# Patient Record
Sex: Male | Born: 1980 | Race: White | Hispanic: No | Marital: Single | Smoking: Never smoker
Health system: Southern US, Community
[De-identification: ages and names within clinical notes are randomized; demographics above are authoritative.]

## PROBLEM LIST (undated history)

## (undated) DIAGNOSIS — G4700 Insomnia, unspecified: Secondary | ICD-10-CM

## (undated) DIAGNOSIS — M549 Dorsalgia, unspecified: Secondary | ICD-10-CM

## (undated) DIAGNOSIS — G8929 Other chronic pain: Secondary | ICD-10-CM

## (undated) HISTORY — PX: OTHER SURGICAL HISTORY: SHX169

---
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
## 2003-04-09 ENCOUNTER — Emergency Department (HOSPITAL_COMMUNITY): Admission: EM | Admit: 2003-04-09 | Discharge: 2003-04-09 | Payer: Self-pay | Admitting: Emergency Medicine

## 2003-04-09 ENCOUNTER — Encounter: Payer: Self-pay | Admitting: Emergency Medicine

## 2003-04-11 ENCOUNTER — Emergency Department (HOSPITAL_COMMUNITY): Admission: EM | Admit: 2003-04-11 | Discharge: 2003-04-11 | Payer: Self-pay | Admitting: Emergency Medicine

## 2003-04-13 ENCOUNTER — Encounter: Payer: Self-pay | Admitting: Family Medicine

## 2003-04-13 ENCOUNTER — Ambulatory Visit (HOSPITAL_COMMUNITY): Admission: RE | Admit: 2003-04-13 | Discharge: 2003-04-13 | Payer: Self-pay | Admitting: Family Medicine

## 2005-12-08 ENCOUNTER — Emergency Department: Payer: Self-pay | Admitting: Emergency Medicine

## 2006-06-26 ENCOUNTER — Other Ambulatory Visit: Payer: Self-pay

## 2006-06-26 ENCOUNTER — Emergency Department: Payer: Self-pay | Admitting: Emergency Medicine

## 2006-06-27 ENCOUNTER — Emergency Department: Payer: Self-pay | Admitting: Internal Medicine

## 2007-08-10 ENCOUNTER — Emergency Department (HOSPITAL_COMMUNITY): Admission: EM | Admit: 2007-08-10 | Discharge: 2007-08-10 | Payer: Self-pay | Admitting: Emergency Medicine

## 2010-10-24 ENCOUNTER — Emergency Department (HOSPITAL_COMMUNITY)
Admission: EM | Admit: 2010-10-24 | Discharge: 2010-10-24 | Disposition: A | Discharge status: Home / Self Care | Payer: Medicaid Other | Attending: Emergency Medicine | Admitting: Emergency Medicine

## 2010-10-24 ENCOUNTER — Emergency Department (HOSPITAL_COMMUNITY): Payer: Medicaid Other

## 2010-10-24 DIAGNOSIS — S93409A Sprain of unspecified ligament of unspecified ankle, initial encounter: Secondary | ICD-10-CM | POA: Insufficient documentation

## 2010-10-24 DIAGNOSIS — S82899A Other fracture of unspecified lower leg, initial encounter for closed fracture: Secondary | ICD-10-CM | POA: Insufficient documentation

## 2010-10-24 DIAGNOSIS — W1809XA Striking against other object with subsequent fall, initial encounter: Secondary | ICD-10-CM | POA: Insufficient documentation

## 2010-11-28 ENCOUNTER — Emergency Department (HOSPITAL_COMMUNITY)
Admission: EM | Admit: 2010-11-28 | Discharge: 2010-11-28 | Disposition: A | Discharge status: Home / Self Care | Payer: Medicaid Other | Attending: Emergency Medicine | Admitting: Emergency Medicine

## 2010-11-28 DIAGNOSIS — W57XXXA Bitten or stung by nonvenomous insect and other nonvenomous arthropods, initial encounter: Secondary | ICD-10-CM | POA: Insufficient documentation

## 2010-11-28 DIAGNOSIS — T148 Other injury of unspecified body region: Secondary | ICD-10-CM | POA: Insufficient documentation

## 2011-01-18 ENCOUNTER — Emergency Department (HOSPITAL_COMMUNITY)
Admission: EM | Admit: 2011-01-18 | Discharge: 2011-01-18 | Discharge status: Against Medical Advice | Payer: Medicaid Other | Attending: Emergency Medicine | Admitting: Emergency Medicine

## 2011-01-18 DIAGNOSIS — S0990XA Unspecified injury of head, initial encounter: Secondary | ICD-10-CM | POA: Insufficient documentation

## 2011-01-18 DIAGNOSIS — W1809XA Striking against other object with subsequent fall, initial encounter: Secondary | ICD-10-CM | POA: Insufficient documentation

## 2011-05-07 ENCOUNTER — Emergency Department (HOSPITAL_COMMUNITY)
Admission: EM | Admit: 2011-05-07 | Discharge: 2011-05-07 | Disposition: A | Discharge status: Home / Self Care | Payer: Medicaid Other | Attending: Emergency Medicine | Admitting: Emergency Medicine

## 2011-05-07 DIAGNOSIS — R21 Rash and other nonspecific skin eruption: Secondary | ICD-10-CM | POA: Insufficient documentation

## 2011-06-02 ENCOUNTER — Emergency Department (HOSPITAL_COMMUNITY)
Admission: EM | Admit: 2011-06-02 | Discharge: 2011-06-03 | Discharge status: Against Medical Advice | Payer: Medicaid Other | Attending: Emergency Medicine | Admitting: Emergency Medicine

## 2011-06-02 ENCOUNTER — Encounter: Payer: Self-pay | Admitting: *Deleted

## 2011-06-02 DIAGNOSIS — R109 Unspecified abdominal pain: Secondary | ICD-10-CM | POA: Insufficient documentation

## 2011-06-02 LAB — URINALYSIS, ROUTINE W REFLEX MICROSCOPIC
Bilirubin Urine: NEGATIVE
Ketones, ur: NEGATIVE mg/dL
Nitrite: NEGATIVE
pH: 7.5 (ref 5.0–8.0)

## 2011-06-02 LAB — URINE MICROSCOPIC-ADD ON

## 2011-06-02 NOTE — ED Notes (Signed)
Rt flank pain since this am.  No nv

## 2011-07-29 ENCOUNTER — Emergency Department (HOSPITAL_COMMUNITY)
Admission: EM | Admit: 2011-07-29 | Discharge: 2011-07-29 | Discharge status: Against Medical Advice | Payer: Medicaid Other | Attending: Emergency Medicine | Admitting: Emergency Medicine

## 2011-07-29 ENCOUNTER — Encounter (HOSPITAL_COMMUNITY): Payer: Self-pay | Admitting: *Deleted

## 2011-07-29 ENCOUNTER — Other Ambulatory Visit: Payer: Self-pay

## 2011-07-29 DIAGNOSIS — R079 Chest pain, unspecified: Secondary | ICD-10-CM | POA: Insufficient documentation

## 2011-07-29 NOTE — ED Notes (Signed)
No old EKG in [facility]. Today's EKG shown to Dr. Lourena Simmonds

## 2011-07-29 NOTE — ED Notes (Signed)
Pt has had  Chest pain mid for 2 days.  nv and diarrhea.  He admits to drinking alcoholo today.  He arrived by [facility] and they placed an iv in his lt hand

## 2011-07-29 NOTE — ED Notes (Signed)
The pt says he cannot wait any longer.  Determined to leave.  Iv removed pt left

## 2011-07-31 ENCOUNTER — Emergency Department (HOSPITAL_COMMUNITY)
Admission: EM | Admit: 2011-07-31 | Discharge: 2011-08-01 | Discharge status: Against Medical Advice | Payer: Medicaid Other | Attending: Emergency Medicine | Admitting: Emergency Medicine

## 2011-07-31 ENCOUNTER — Encounter (HOSPITAL_COMMUNITY): Payer: Self-pay | Admitting: Emergency Medicine

## 2011-07-31 DIAGNOSIS — R059 Cough, unspecified: Secondary | ICD-10-CM | POA: Insufficient documentation

## 2011-07-31 DIAGNOSIS — R05 Cough: Secondary | ICD-10-CM | POA: Insufficient documentation

## 2011-07-31 MED ORDER — ACETAMINOPHEN 325 MG PO TABS
650.0000 mg | ORAL_TABLET | Freq: Once | ORAL | Status: AC
  Administered 2011-07-31: 650 mg via ORAL

## 2011-07-31 MED ORDER — ACETAMINOPHEN 325 MG PO TABS
ORAL_TABLET | ORAL | Status: AC
  Filled 2011-07-31: qty 2

## 2011-07-31 NOTE — ED Notes (Signed)
Pt alert, presents with cough and congestion, onset was yesterday, resp even unlabored, skin pwd, moist npc noted

## 2012-03-27 ENCOUNTER — Encounter (HOSPITAL_COMMUNITY): Payer: Self-pay | Admitting: *Deleted

## 2012-03-27 ENCOUNTER — Emergency Department (HOSPITAL_COMMUNITY)
Admission: EM | Admit: 2012-03-27 | Discharge: 2012-03-28 | Disposition: A | Discharge status: Home / Self Care | Payer: Medicaid Other | Attending: Emergency Medicine | Admitting: Emergency Medicine

## 2012-03-27 ENCOUNTER — Emergency Department (HOSPITAL_COMMUNITY): Payer: Medicaid Other

## 2012-03-27 DIAGNOSIS — J45909 Unspecified asthma, uncomplicated: Secondary | ICD-10-CM | POA: Insufficient documentation

## 2012-03-27 DIAGNOSIS — F10929 Alcohol use, unspecified with intoxication, unspecified: Secondary | ICD-10-CM

## 2012-03-27 DIAGNOSIS — F1010 Alcohol abuse, uncomplicated: Secondary | ICD-10-CM | POA: Insufficient documentation

## 2012-03-27 DIAGNOSIS — I10 Essential (primary) hypertension: Secondary | ICD-10-CM | POA: Insufficient documentation

## 2012-03-27 DIAGNOSIS — R079 Chest pain, unspecified: Secondary | ICD-10-CM | POA: Insufficient documentation

## 2012-03-27 LAB — COMPREHENSIVE METABOLIC PANEL
ALT: 44 U/L (ref 0–53)
Alkaline Phosphatase: 66 U/L (ref 39–117)
BUN: 7 mg/dL (ref 6–23)
CO2: 25 mEq/L (ref 19–32)
Chloride: 104 mEq/L (ref 96–112)
GFR calc Af Amer: >90 mL/min (ref >90)
Glucose, Bld: 101 mg/dL — ABNORMAL HIGH (ref 70–99)
Potassium: 4.1 mEq/L (ref 3.5–5.1)
Total Bilirubin: 0.1 mg/dL — ABNORMAL LOW (ref 0.3–1.2)

## 2012-03-27 LAB — CBC WITH DIFFERENTIAL/PLATELET
Hemoglobin: 15 g/dL (ref 13.0–17.0)
Lymphocytes Relative: 55 % — ABNORMAL HIGH (ref 12–46)
Lymphs Abs: 4.9 10*3/uL — ABNORMAL HIGH (ref 0.7–4.0)
MCH: 30.7 pg (ref 26.0–34.0)
Monocytes Relative: 5 % (ref 3–12)
Neutro Abs: 3.3 10*3/uL (ref 1.7–7.7)
Neutrophils Relative %: 36 % — ABNORMAL LOW (ref 43–77)
RBC: 4.89 MIL/uL (ref 4.22–5.81)
WBC: 9 10*3/uL (ref 4.0–10.5)

## 2012-03-27 LAB — ETHANOL: Alcohol, Ethyl (B): 157 mg/dL — ABNORMAL HIGH (ref 0–11)

## 2012-03-27 MED ORDER — ALBUTEROL SULFATE HFA 108 (90 BASE) MCG/ACT IN AERS
2.0000 | INHALATION_SPRAY | Freq: Four times a day (QID) | RESPIRATORY_TRACT | Status: DC
  Administered 2012-03-27: 2 via RESPIRATORY_TRACT
  Filled 2012-03-27: qty 6.7

## 2012-03-27 NOTE — ED Provider Notes (Signed)
History     CSN: [id number]  Arrival date & time 03/27/12  1954   First MD Initiated Contact with Patient 03/27/12 2032      Chief Complaint  Patient presents with  . Fall  . Alcohol Intoxication   HPI The patient presents with chest pain.  History of present illness is per the patient and his sister.  They note that yesterday the patient developed anterior chest discomfort with mild dyspnea.  The patient has a history of asthma, ran out of his inhaler yesterday.  She also notes that the patient fell in the bathtub yesterday without loss of consciousness or significant head trauma.  The patient is sleeping throughout most my exam, but awakens easily answers questions, but also sleeping quickly.  He notes that he was in his usual state of health until yesterday.  He endorses alcohol use, up to 2 beers daily.  He denies any drug use. Past Medical History  Diagnosis Date  . Asthma   . Hypertension   . Bilateral posterior subcapsular polar cataract     Past Surgical History  Procedure Date  . None no surgery    History reviewed. No pertinent family history.  History  Substance Use Topics  . Smoking status: Never Smoker   . Smokeless tobacco: Not on file  . Alcohol Use: Yes      Review of Systems  Constitutional:       Per HPI, otherwise negative  HENT:       Per HPI, otherwise negative  Eyes: Negative.   Respiratory:       Per HPI, otherwise negative  Cardiovascular:       Per HPI, otherwise negative  Gastrointestinal: Negative for vomiting.  Genitourinary: Negative.   Musculoskeletal:       Per HPI, otherwise negative  Skin: Negative.   Neurological: Negative for syncope.    Allergies  Penicillins and Sulfa antibiotics  Home Medications  No current outpatient prescriptions on file.  BP 112/69  Pulse 88  Temp 97.5 F (36.4 C) (Oral)  Resp 12  SpO2 93%  Physical Exam  Nursing note and vitals reviewed. Constitutional: He is oriented to person, place,  and time. He appears well-developed. No distress.       Patient is asleep, but awakens easily, speaks clearly, falls back asleep quickly  HENT:  Head: Normocephalic and atraumatic.  Eyes: Conjunctivae and EOM are normal.  Cardiovascular: Normal rate and regular rhythm.   Pulmonary/Chest: Effort normal. No stridor. No respiratory distress. He has no wheezes. He has no rales.  Abdominal: He exhibits no distension.  Musculoskeletal: He exhibits no edema.  Neurological: He is alert and oriented to person, place, and time.  Skin: Skin is warm and dry.  Psychiatric: He has a normal mood and affect.    ED Course  Procedures (including critical care time)   Labs Reviewed  CBC WITH DIFFERENTIAL  COMPREHENSIVE METABOLIC PANEL  ETHANOL   No results found.   No diagnosis found.  O2: 100%ra, normal  11:28 PM Patient sleeping  Etoh: 157   Date: 03/27/2012  Rate: 86  Rhythm: normal sinus rhythm  QRS Axis: normal  Intervals: normal  ST/T Wave abnormalities: normal  Conduction Disutrbances:none  Narrative Interpretation:   Old EKG Reviewed: none available RSR, borderline q waves inferiorly Abnormal ecg    MDM  This young male presents with concerns of chest pain, a fall, and on exam is sleeping, though she awakens easily, and is in no distress.  The patient's vital signs are unremarkable, there is no overt evidence of trauma.  The patient denies any head trauma or loss of consciousness.  The patient's chest pain may be secondary to this fall against the bathtub or his sister of asthma, though his lungs are clear on exam.  The patient's labs notable for demonstration of alcohol intoxication.  His ECG is nonischemic, non-dysrhythmic, and given the absence of acute findings, but worse crackers, the patient is appropriate for discharge into the care of his sister, with outpatient followup.   Gerhard Munch, MD 03/27/12 [phone]

## 2012-03-27 NOTE — ED Notes (Signed)
Pt fell in bath tub yesterday no LOC,  Side pain and pt also has ETOH on board,  Drank two 40 ounces today

## 2012-03-27 NOTE — ED Notes (Signed)
Bed:WA06<BR> Expected date:<BR> Expected time:<BR> Means of arrival:<BR> Comments:<BR>

## 2012-03-27 NOTE — ED Notes (Signed)
1st attempt to obtain labs pt in x-ray °

## 2012-04-01 ENCOUNTER — Encounter (HOSPITAL_COMMUNITY): Payer: Self-pay | Admitting: Emergency Medicine

## 2012-04-01 DIAGNOSIS — I10 Essential (primary) hypertension: Secondary | ICD-10-CM | POA: Insufficient documentation

## 2012-04-01 DIAGNOSIS — Z880 Allergy status to penicillin: Secondary | ICD-10-CM | POA: Insufficient documentation

## 2012-04-01 DIAGNOSIS — J45909 Unspecified asthma, uncomplicated: Secondary | ICD-10-CM | POA: Insufficient documentation

## 2012-04-01 DIAGNOSIS — Z882 Allergy status to sulfonamides status: Secondary | ICD-10-CM | POA: Insufficient documentation

## 2012-04-01 DIAGNOSIS — L0211 Cutaneous abscess of neck: Secondary | ICD-10-CM | POA: Insufficient documentation

## 2012-04-01 NOTE — ED Notes (Signed)
Abccess behind left ear. Tried to pop but became worse. Is reddened with purulent drainage.

## 2012-04-02 ENCOUNTER — Emergency Department (HOSPITAL_COMMUNITY)
Admission: EM | Admit: 2012-04-02 | Discharge: 2012-04-02 | Disposition: A | Discharge status: Home / Self Care | Payer: Medicaid Other | Attending: Emergency Medicine | Admitting: Emergency Medicine

## 2012-04-02 DIAGNOSIS — L0291 Cutaneous abscess, unspecified: Secondary | ICD-10-CM

## 2012-04-02 NOTE — ED Provider Notes (Signed)
History     CSN: [id number]  Arrival date & time 04/01/12  2211   First MD Initiated Contact with Patient 04/02/12 0128      Chief Complaint  Patient presents with  . Abscess    (Consider location/radiation/quality/duration/timing/severity/associated sxs/prior treatment) Patient is a 31 y.o. male presenting with abscess. The history is provided by the patient.  Abscess  This is a new problem. The current episode started less than one week ago. The problem occurs rarely. The problem has been gradually worsening. The abscess is present on the neck. The problem is mild. The abscess is characterized by redness, painfulness and draining. It is unknown what he was exposed to. Pertinent negatives include no anorexia, no decrease in physical activity, no fever, no fussiness, not sleeping more, no congestion, no rhinorrhea and no sore throat.    Past Medical History  Diagnosis Date  . Asthma   . Hypertension   . Bilateral posterior subcapsular polar cataract     Past Surgical History  Procedure Date  . None no surgery    No family history on file.  History  Substance Use Topics  . Smoking status: Never Smoker   . Smokeless tobacco: Not on file  . Alcohol Use: Yes      Review of Systems  Constitutional: Negative for fever, chills and appetite change.  HENT: Negative for congestion, sore throat and rhinorrhea.   Eyes: Negative for visual disturbance.  Respiratory: Negative for shortness of breath.   Cardiovascular: Negative for chest pain and leg swelling.  Gastrointestinal: Negative for abdominal pain and anorexia.  Genitourinary: Negative for dysuria, urgency and frequency.  Neurological: Negative for dizziness, syncope, weakness, light-headedness, numbness and headaches.  Psychiatric/Behavioral: Negative for confusion.    Allergies  Penicillins and Sulfa antibiotics  Home Medications   Current Outpatient Rx  Name Route Sig Dispense Refill  . ALBUTEROL SULFATE HFA  108 (90 BASE) MCG/ACT IN AERS Inhalation Inhale 2 puffs into the lungs every 6 (six) hours as needed. For asthma symptoms    . OXYCODONE-ACETAMINOPHEN 10-325 MG PO TABS Oral Take 1 tablet by mouth every 4 (four) hours as needed. For pain      BP 126/86  Pulse 87  Temp 98.5 F (36.9 C) (Oral)  Resp 18  SpO2 99%  Physical Exam  Nursing note and vitals reviewed. Constitutional: He is oriented to person, place, and time. He appears well-developed and well-nourished. He does not have a sickly appearance. He does not appear ill. No distress.  HENT:  Head: Normocephalic and atraumatic.  Eyes: Conjunctivae and EOM are normal.  Neck: Normal range of motion. Neck supple.  Cardiovascular: Normal rate and regular rhythm.   Pulmonary/Chest: Effort normal and breath sounds normal.  Musculoskeletal: He exhibits no edema.  Lymphadenopathy:       Head (right side): No submental, no preauricular and no posterior auricular adenopathy present.       Head (left side): No submental, no submandibular, no preauricular and no posterior auricular adenopathy present.    He has no axillary adenopathy.  Neurological: He is alert and oriented to person, place, and time.  Skin: Skin is warm and dry. No rash noted. He is not diaphoretic.       3cm sized abscess located on left neck. Extreme tenderness to palpation. Currently draining. Abscess is fluctuant without warmth, surrounding erythema, or induration.    ED Course  Procedures (including critical care time)  Labs Reviewed - No data to display No results found.  No diagnosis found.  INCISION AND DRAINAGE Performed by: Jaci Carrel Consent: Verbal consent obtained. Risks and benefits: risks, benefits and alternatives were discussed Type: abscess  Body area: left neck   Anesthesia: local infiltration  Local anesthetic: lidocaine 2% w epinephrine  Anesthetic total: 2 ml  Complexity: complex Blunt dissection to break up  loculations  Drainage: purulent  Drainage amount: scant   Packing material: not warranted   Patient tolerance: Patient tolerated the procedure well with no immediate complications.     MDM  Abscess Neck  Patient with skin abscess amenable to incision and drainage.  Abscess was not  large enough to warrant packing, wound recheck in 2 days. No signs of cellulitis is surrounding skin.  Will d/c to home.  No antibiotic therapy is indicated.         Jaci Carrel, [place] 04/02/12 [phone]

## 2012-04-03 NOTE — ED Provider Notes (Signed)
History/physical exam/procedure(s) were performed by non-physician practitioner and as supervising physician I was immediately available for consultation/collaboration. I have reviewed all notes and am in agreement with care and plan.   Marvie Brevik S Sevastian Witczak, MD 04/03/12 0116 

## 2012-04-04 ENCOUNTER — Emergency Department (HOSPITAL_COMMUNITY)
Admission: EM | Admit: 2012-04-04 | Discharge: 2012-04-04 | Disposition: A | Discharge status: Home / Self Care | Payer: Medicaid Other | Attending: Emergency Medicine | Admitting: Emergency Medicine

## 2012-04-04 ENCOUNTER — Encounter (HOSPITAL_COMMUNITY): Payer: Self-pay | Admitting: Emergency Medicine

## 2012-04-04 DIAGNOSIS — L0291 Cutaneous abscess, unspecified: Secondary | ICD-10-CM

## 2012-04-04 DIAGNOSIS — Z880 Allergy status to penicillin: Secondary | ICD-10-CM | POA: Insufficient documentation

## 2012-04-04 DIAGNOSIS — I10 Essential (primary) hypertension: Secondary | ICD-10-CM | POA: Insufficient documentation

## 2012-04-04 DIAGNOSIS — J45909 Unspecified asthma, uncomplicated: Secondary | ICD-10-CM | POA: Insufficient documentation

## 2012-04-04 DIAGNOSIS — Z882 Allergy status to sulfonamides status: Secondary | ICD-10-CM | POA: Insufficient documentation

## 2012-04-04 DIAGNOSIS — L0211 Cutaneous abscess of neck: Secondary | ICD-10-CM | POA: Insufficient documentation

## 2012-04-04 DIAGNOSIS — L03221 Cellulitis of neck: Secondary | ICD-10-CM | POA: Insufficient documentation

## 2012-04-04 MED ORDER — HYDROCODONE-ACETAMINOPHEN 5-500 MG PO TABS: 1.0000 | ORAL_TABLET | Freq: Four times a day (QID) | ORAL | Status: AC | PRN

## 2012-04-04 MED ORDER — DOXYCYCLINE HYCLATE 100 MG PO CAPS: 100.0000 mg | ORAL_CAPSULE | Freq: Two times a day (BID) | ORAL | Status: AC

## 2012-04-04 NOTE — ED Provider Notes (Signed)
History     CSN: [id number]  Arrival date & time 04/04/12  1531   First MD Initiated Contact with Patient 04/04/12 1613      Chief Complaint  Patient presents with  . Abscess  . Follow-up    (Consider location/radiation/quality/duration/timing/severity/associated sxs/prior treatment) HPI Comments: Here for follow up of abscess I and D to the left neck.  Still hurts.  No more drainage and denies fevers.  No other complaints.     Past Medical History  Diagnosis Date  . Asthma   . Hypertension   . Bilateral posterior subcapsular polar cataract     Past Surgical History  Procedure Date  . None no surgery    No family history on file.  History  Substance Use Topics  . Smoking status: Never Smoker   . Smokeless tobacco: Not on file  . Alcohol Use: Yes      Review of Systems  All other systems reviewed and are negative.    Allergies  Penicillins and Sulfa antibiotics  Home Medications   Current Outpatient Rx  Name Route Sig Dispense Refill  . ALBUTEROL SULFATE HFA 108 (90 BASE) MCG/ACT IN AERS Inhalation Inhale 2 puffs into the lungs every 6 (six) hours as needed. For asthma symptoms    . OXYCODONE-ACETAMINOPHEN 10-325 MG PO TABS Oral Take 1 tablet by mouth every 4 (four) hours as needed. For pain      BP 137/88  Pulse 99  Temp 98.4 F (36.9 C) (Oral)  Resp 20  SpO2 99%  Physical Exam  Nursing note and vitals reviewed. Constitutional: He appears well-developed and well-nourished. No distress.  HENT:  Head: Normocephalic and atraumatic.  Neck: Normal range of motion. Neck supple.       There is a small dime-sized erythematous, tender area to the left side of the neck.    Skin: He is not diaphoretic.    ED Course  Procedures (including critical care time)  Labs Reviewed - No data to display No results found.   No diagnosis found.    MDM  The abscess looks to have erythema now that was not present in the previous provider's note.  He will be  given bactrim, pain med as he is reporting significant pain.  To return prn.        Geoffery Lyons, MD 04/04/12 [phone]

## 2012-04-04 NOTE — ED Notes (Signed)
Pt stated that he was here Thursday and had and I&D to abscess on left side of neck. He was told to come back to follow up here. Pt stated that the area still hurts, 10/10 pain.

## 2012-06-19 ENCOUNTER — Encounter: Payer: Self-pay | Admitting: Family Medicine

## 2012-06-19 ENCOUNTER — Ambulatory Visit (INDEPENDENT_AMBULATORY_CARE_PROVIDER_SITE_OTHER): Payer: Medicaid Other | Admitting: Family Medicine

## 2012-06-19 VITALS — BP 126/90 | HR 103 | Temp 98.3°F | Ht 67.0 in | Wt 207.0 lb

## 2012-06-19 DIAGNOSIS — M549 Dorsalgia, unspecified: Secondary | ICD-10-CM

## 2012-06-19 DIAGNOSIS — L819 Disorder of pigmentation, unspecified: Secondary | ICD-10-CM

## 2012-06-19 DIAGNOSIS — R030 Elevated blood-pressure reading, without diagnosis of hypertension: Secondary | ICD-10-CM

## 2012-06-19 DIAGNOSIS — Z23 Encounter for immunization: Secondary | ICD-10-CM

## 2012-06-19 MED ORDER — DICLOFENAC SODIUM 75 MG PO TBEC: 75.0000 mg | DELAYED_RELEASE_TABLET | Freq: Two times a day (BID) | ORAL | Status: DC

## 2012-06-19 MED ORDER — TRIAMCINOLONE ACETONIDE 0.1 % EX CREA: TOPICAL_CREAM | Freq: Two times a day (BID) | CUTANEOUS | Status: DC

## 2012-06-19 NOTE — Assessment & Plan Note (Signed)
Most likely MSK pain from previous injury. Will not treat with chronic narcotics. I have given him Rx for Voltaren to use as needed. Can also try lidoderm patches if needed in future. Patient should stretch back and exercise as much as possible. No imaging done today as not acutely indicated.

## 2012-06-19 NOTE — Patient Instructions (Signed)
It was nice to meet you today.  I have sent in your Voltaren for your back pain, as well as Triamcinolone for your leg rash.  Please come back to see me in 3 months to recheck your blood pressure. If you need anything before then, do not hesitate to let me know.  Take care! Anis Cinelli M. Bellanie Matthew, M.D.
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
## 2012-06-19 NOTE — Addendum Note (Signed)
Addended by: Damita Lack on: 06/19/2012 03:44 PM   Modules accepted: Orders

## 2012-06-19 NOTE — Assessment & Plan Note (Signed)
BP elevated in clinic today. Strong family history of HTN. Will RTC in 3 months for recheck prior to starting anti-hypertensives.

## 2012-06-19 NOTE — Assessment & Plan Note (Signed)
Most likely secondary to skin irritation (ie- bug bites, scratches) that the patient that continued to scratch. Will give Rx for Triamcinolone to use on irritated skin as needed.

## 2012-08-27 ENCOUNTER — Ambulatory Visit: Payer: Medicaid Other | Admitting: Family Medicine

## 2012-10-02 ENCOUNTER — Encounter: Payer: Medicaid Other | Admitting: Family Medicine

## 2012-10-28 ENCOUNTER — Encounter: Payer: Medicaid Other | Admitting: Family Medicine

## 2012-11-30 ENCOUNTER — Encounter: Payer: Self-pay | Admitting: Family Medicine

## 2012-11-30 ENCOUNTER — Ambulatory Visit (INDEPENDENT_AMBULATORY_CARE_PROVIDER_SITE_OTHER): Payer: Medicaid Other | Admitting: Family Medicine

## 2012-11-30 ENCOUNTER — Ambulatory Visit (HOSPITAL_COMMUNITY)
Admission: RE | Admit: 2012-11-30 | Discharge: 2012-11-30 | Disposition: A | Discharge status: Home / Self Care | Payer: Medicaid Other | Source: Ambulatory Visit | Attending: Family Medicine | Admitting: Family Medicine

## 2012-11-30 VITALS — BP 128/83 | HR 86 | Temp 98.4°F | Ht 67.0 in | Wt 209.0 lb

## 2012-11-30 DIAGNOSIS — L0390 Cellulitis, unspecified: Secondary | ICD-10-CM

## 2012-11-30 DIAGNOSIS — M549 Dorsalgia, unspecified: Secondary | ICD-10-CM

## 2012-11-30 DIAGNOSIS — M545 Low back pain, unspecified: Secondary | ICD-10-CM | POA: Insufficient documentation

## 2012-11-30 DIAGNOSIS — G8929 Other chronic pain: Secondary | ICD-10-CM | POA: Insufficient documentation

## 2012-11-30 MED ORDER — DIPHENHYDRAMINE-ZINC ACETATE 1-0.1 % EX CREA: TOPICAL_CREAM | Freq: Three times a day (TID) | CUTANEOUS | Status: DC | PRN

## 2012-11-30 MED ORDER — ALBUTEROL SULFATE HFA 108 (90 BASE) MCG/ACT IN AERS: 2.0000 | INHALATION_SPRAY | Freq: Four times a day (QID) | RESPIRATORY_TRACT | Status: DC | PRN

## 2012-11-30 MED ORDER — DOXYCYCLINE HYCLATE 100 MG PO TABS: 100.0000 mg | ORAL_TABLET | Freq: Two times a day (BID) | ORAL | Status: DC

## 2012-11-30 MED ORDER — MELOXICAM 15 MG PO TABS: 15.0000 mg | ORAL_TABLET | Freq: Every day | ORAL | Status: DC

## 2012-11-30 NOTE — Assessment & Plan Note (Signed)
A: chronic low back pain. Normal x-ray P: NSAID Back exercises PCP f/u.

## 2012-11-30 NOTE — Patient Instructions (Addendum)
Mr. Dershem,  Thank you for coming in today.  Rash: appears to be a bite with secondary infection. Take doxy twice daily for 10 days. Must sit up for 1 hr after taking.  Use benadryl cream for itch.  Chronic low back pain:  mobic daily Back strengthening: planks.  Go for x-ray, [facility]. I will call with results.   F/u with Dr. Mikel Cella in 3-4 weeks.  Bring completed scat for (part A)  To follow up appt and Part B for Dr. Mikel Cella.   Refilled albuterol  Dr. Armen Pickup
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
## 2012-11-30 NOTE — Assessment & Plan Note (Signed)
A: back cellulitis. Suspect bug bite with secondary infection.  P:  Doxycycline to cover for MRSA Topical benadryl prn itching.

## 2012-12-01 ENCOUNTER — Telehealth: Payer: Self-pay | Admitting: *Deleted

## 2012-12-01 NOTE — Telephone Encounter (Signed)
Patient called back and was given the results of the xray.

## 2012-12-01 NOTE — Telephone Encounter (Signed)
Message copied by Feliz Beam on Tue Dec 01, 2012 10:02 AM ------      Message from: Dessa Phi      Created: Mon Nov 30, 2012  9:36 PM       Normal low back x-ray      Please inform patient.       ------

## 2012-12-01 NOTE — Telephone Encounter (Signed)
LM for pt to call back.  Please give him message about xrays when he calls back. Thank Feliz Beam, CMA

## 2012-12-03 ENCOUNTER — Telehealth: Payer: Self-pay | Admitting: Family Medicine

## 2012-12-03 MED ORDER — CLINDAMYCIN HCL 300 MG PO CAPS: 300.0000 mg | ORAL_CAPSULE | Freq: Three times a day (TID) | ORAL | Status: DC

## 2012-12-03 NOTE — Telephone Encounter (Signed)
Spoke with pt and he states that he has tried taking antibiotic with and without food and both ways made him vomit, would prefer if he had different antibiotic. States that pain med is not hurting his stomach at all. Wyatt Haste, RN-BSN

## 2012-12-03 NOTE — Telephone Encounter (Signed)
Patient was prescribed an abx that is making him throw up and he wants to know if something else can be sent in for him.

## 2012-12-03 NOTE — Telephone Encounter (Signed)
Given his allergies the only other medication that can be called in would be Clindamycin which can also cause GI upset. Advise patient to make sure he is taking Doxy with food. If he had rather change antibiotics, we can send that in. Just let me know.  Emmali Karow M. Dajha Urquilla, M.D.

## 2012-12-03 NOTE — Telephone Encounter (Signed)
Clindamycin 300mg  TID x 7 days has been sent to the pharmacy. He should stop the Doxy and start taking this. It may very likely cause upset stomach as well. If he has any diarrhea associated with it, he should let us know.  Also, if he cannot tolerate this or if he is not getting better, he should come back to the clinic for evaluation.  Amber M. Hairford, M.D.

## 2012-12-03 NOTE — Telephone Encounter (Signed)
Please advise doxy. Is making pt sick to stomach and vomiting. Would like different antibiotic to be called in if possible. Wyatt Haste, RN-BSN

## 2012-12-04 NOTE — Telephone Encounter (Signed)
Pt called and message left regarding instructions for mew meds.If further questions , instructed to call clinic Wyatt Haste, RN-BSN

## 2012-12-04 NOTE — Telephone Encounter (Signed)
Pt called back.  I clarified instructions left by [name]. Antonio Lane, Antonio Lane

## 2012-12-28 ENCOUNTER — Encounter: Payer: Self-pay | Admitting: Family Medicine

## 2012-12-28 ENCOUNTER — Ambulatory Visit (INDEPENDENT_AMBULATORY_CARE_PROVIDER_SITE_OTHER): Payer: Medicaid Other | Admitting: Family Medicine

## 2012-12-28 VITALS — BP 134/88 | HR 99 | Temp 98.0°F | Ht 67.0 in | Wt 210.0 lb

## 2012-12-28 DIAGNOSIS — L0390 Cellulitis, unspecified: Secondary | ICD-10-CM

## 2012-12-28 DIAGNOSIS — L0291 Cutaneous abscess, unspecified: Secondary | ICD-10-CM

## 2012-12-28 DIAGNOSIS — M549 Dorsalgia, unspecified: Secondary | ICD-10-CM

## 2012-12-28 MED ORDER — CYCLOBENZAPRINE HCL 10 MG PO TABS: 10.0000 mg | ORAL_TABLET | Freq: Three times a day (TID) | ORAL | Status: DC | PRN

## 2012-12-28 MED ORDER — TRAMADOL HCL 50 MG PO TABS: 50.0000 mg | ORAL_TABLET | Freq: Three times a day (TID) | ORAL | Status: DC | PRN

## 2012-12-28 MED ORDER — KETOROLAC TROMETHAMINE 30 MG/ML IJ SOLN
30.0000 mg | Freq: Once | INTRAMUSCULAR | Status: AC
  Administered 2012-12-28: 30 mg via INTRAMUSCULAR

## 2012-12-28 NOTE — Progress Notes (Signed)
Patient ID: Antonio Lane, male   DOB: [birth year], 32 y.o.   MRN: [id number]  [facility] Antonio M. Hairford, MD Phone: [phone]   Subjective: HPI: Patient is a 32 y.o. male presenting to clinic today for follow up appointment for back pain.  Fell 2 weeks ago playing football, fell on back and it has been hurting since then. Did not have LOC. Radiates up to neck. Was seen by Dr. Armen Lane and had a normal X-ray. Pain is worse when lying flat. Pain is better sitting forwards. No pain or numbness in arms/legs. Denies HA, fevers, incontinence. He has been taking a family member's percocet which helps. Has not done any exercises.  Also had rash on back, was unable to tolerate the Doxy and Clinda due to GI upset. States he does not have diarrhea now. Using Triamcinolone cream which has helped and rash has gone away.   History Reviewed: Non-smoker; uses smokeless tobacco. Health Maintenance: UTD  ROS: Please see HPI above.  Objective: Office vital signs reviewed. BP 134/88  Pulse 99  Temp(Src) 98 F (36.7 C) (Oral)  Ht 5\' 7"  (1.702 m)  Wt 210 lb (95.255 kg)  BMI 32.88 kg/m2  Physical Examination:  General: Awake, alert. NAD. Sitting in chair, leaned forward on counter. HEENT: Atraumatic, normocephalic. Right eye externally rotated. Multiple dental caries Neck: No masses palpated. No LAD. TTP of paracervical muscles. FROM of neck without discmofort Back: TTP Midline lumbar spine as well as paraspinal muscles. Normal gait. Limited ROM of back vs. Effort on exam. One 2 cm lesion right upper back with surrounding erythema without fluctuance.  Extremities: No edema Neuro: Grossly intact, no sensory changes  Assessment: 32 y.o. male with back pain  Plan: See Problem List and After Visit Summary

## 2012-12-28 NOTE — Patient Instructions (Addendum)
I'm sorry your back is still hurting.   You will have 2 medicine at the drug store: 1. Tramadol- Pain. Up to 3 times per day for the next week 2. Flexeril- Muscle relaxer. Take only when you can rest. Two times per day for the next week.  Use ice on your back and neck as tolerated. And once you feel better, start stretching your back.  Frazer [place] M. Devonta Blanford, M.D.    Back Exercises Back exercises help treat and prevent back injuries. The goal is to increase your strength in your belly (abdominal) and back muscles. These exercises can also help with flexibility. Start these exercises when told by your doctor. HOME CARE Back exercises include: Pelvic Tilt.  Lie on your back with your knees bent. Tilt your pelvis until the lower part of your back is against the floor. Hold this position 5 to 10 sec. Repeat this exercise 5 to 10 times. Knee to Chest.  Pull 1 knee up against your chest and hold for 20 to 30 seconds. Repeat this with the other knee. This may be done with the other leg straight or bent, whichever feels better. Then, pull both knees up against your chest. Sit-Ups or Curl-Ups.  Bend your knees 90 degrees. Start with tilting your pelvis, and do a partial, slow sit-up. Only lift your upper half 30 to 45 degrees off the floor. Take at least 2 to 3 seonds for each sit-up. Do not do sit-ups with your knees out straight. If partial sit-ups are difficult, simply do the above but with only tightening your belly (abdominal) muscles and holding it as told. Hip-Lift.  Lie on your back with your knees flexed 90 degrees. Push down with your feet and shoulders as you raise your hips 2 inches off the floor. Hold for 10 seconds, repeat 5 to 10 times. Back Arches.  Lie on your stomach. Prop yourself up on bent elbows. Slowly press on your hands, causing an arch in your low back. Repeat 3 to 5 times. Shoulder-Lifts.  Lie face down with arms beside your body. Keep hips and belly pressed to floor as  you slowly lift your head and shoulders off the floor. Do not overdo your exercises. Be careful in the beginning. Exercises may cause you some mild back discomfort. If the pain lasts for more than 15 minutes, stop the exercises until you see your doctor. Improvement with exercise for back problems is slow.  Document Released: 08/17/2010 Document Revised: 10/07/2011 Document Reviewed: 05/16/2011 [facility] Patient Information 2014 [place], [place].

## 2012-12-28 NOTE — Assessment & Plan Note (Signed)
Most likely muscle strain given exam and normal X-ray. Patient is protecting his back due to the pain. Is not take anti-inflammatory. Will give Toradol shot in clinic today, and Rx for 10 days of Flexeril and Tramadol. Discussed with patient that I do not prescribe long term medications for chronic pain and that he should start rehab for his back as soon as possible. RTC if it worsens.

## 2012-12-29 ENCOUNTER — Other Ambulatory Visit: Payer: Self-pay

## 2012-12-29 ENCOUNTER — Encounter (HOSPITAL_COMMUNITY): Payer: Self-pay | Admitting: Family Medicine

## 2012-12-29 ENCOUNTER — Emergency Department (HOSPITAL_COMMUNITY)
Admission: EM | Admit: 2012-12-29 | Discharge: 2012-12-30 | Disposition: A | Discharge status: Home / Self Care | Payer: Medicaid Other | Attending: Emergency Medicine | Admitting: Emergency Medicine

## 2012-12-29 ENCOUNTER — Emergency Department (HOSPITAL_COMMUNITY): Payer: Medicaid Other

## 2012-12-29 DIAGNOSIS — R0789 Other chest pain: Secondary | ICD-10-CM | POA: Insufficient documentation

## 2012-12-29 DIAGNOSIS — Z880 Allergy status to penicillin: Secondary | ICD-10-CM | POA: Insufficient documentation

## 2012-12-29 DIAGNOSIS — J45909 Unspecified asthma, uncomplicated: Secondary | ICD-10-CM | POA: Insufficient documentation

## 2012-12-29 DIAGNOSIS — I10 Essential (primary) hypertension: Secondary | ICD-10-CM | POA: Insufficient documentation

## 2012-12-29 DIAGNOSIS — Z882 Allergy status to sulfonamides status: Secondary | ICD-10-CM | POA: Insufficient documentation

## 2012-12-29 DIAGNOSIS — G8929 Other chronic pain: Secondary | ICD-10-CM | POA: Insufficient documentation

## 2012-12-29 HISTORY — DX: Insomnia, unspecified: G47.00

## 2012-12-29 HISTORY — DX: Other chronic pain: G89.29

## 2012-12-29 HISTORY — DX: Dorsalgia, unspecified: M54.9

## 2012-12-29 LAB — CBC
HCT: 41 % (ref 39.0–52.0)
MCV: 88.9 fL (ref 78.0–100.0)
Platelets: 338 10*3/uL (ref 150–400)
RBC: 4.61 MIL/uL (ref 4.22–5.81)
WBC: 7.9 10*3/uL (ref 4.0–10.5)

## 2012-12-29 LAB — URINALYSIS, ROUTINE W REFLEX MICROSCOPIC
Bilirubin Urine: NEGATIVE
Glucose, UA: NEGATIVE mg/dL
Hgb urine dipstick: NEGATIVE
Ketones, ur: NEGATIVE mg/dL
Leukocytes, UA: NEGATIVE
Protein, ur: NEGATIVE mg/dL

## 2012-12-29 LAB — POCT I-STAT, CHEM 8
BUN: 5 mg/dL — ABNORMAL LOW (ref 6–23)
Calcium, Ion: 1.09 mmol/L — ABNORMAL LOW (ref 1.12–1.23)
Creatinine, Ser: 1 mg/dL (ref 0.50–1.35)
Glucose, Bld: 106 mg/dL — ABNORMAL HIGH (ref 70–99)
TCO2: 26 mmol/L (ref 0–100)

## 2012-12-29 LAB — COMPREHENSIVE METABOLIC PANEL
AST: 37 U/L (ref 0–37)
CO2: 24 mEq/L (ref 19–32)
Chloride: 107 mEq/L (ref 96–112)
Creatinine, Ser: 0.64 mg/dL (ref 0.50–1.35)
GFR calc non Af Amer: >90 mL/min (ref >90)
Total Bilirubin: 0.2 mg/dL — ABNORMAL LOW (ref 0.3–1.2)

## 2012-12-29 MED ORDER — SODIUM CHLORIDE 0.9 % IV BOLUS (SEPSIS)
1000.0000 mL | Freq: Once | INTRAVENOUS | Status: AC
  Administered 2012-12-29: 1000 mL via INTRAVENOUS
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
## 2012-12-29 NOTE — ED Notes (Signed)
PA at bedside.
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
## 2012-12-29 NOTE — ED Notes (Signed)
Pt transported to radiology.

## 2012-12-30 NOTE — ED Provider Notes (Signed)
Medical screening examination/treatment/procedure(s) were performed by non-physician practitioner and as supervising physician I was immediately available for consultation/collaboration.  Vila Dory L Gorje Iyer, MD 12/30/12 1741 

## 2012-12-31 ENCOUNTER — Encounter (HOSPITAL_COMMUNITY): Payer: Self-pay | Admitting: Emergency Medicine

## 2012-12-31 ENCOUNTER — Emergency Department (HOSPITAL_COMMUNITY): Payer: Medicaid Other

## 2012-12-31 ENCOUNTER — Other Ambulatory Visit: Payer: Self-pay

## 2012-12-31 ENCOUNTER — Emergency Department (HOSPITAL_COMMUNITY)
Admission: EM | Admit: 2012-12-31 | Discharge: 2012-12-31 | Disposition: A | Discharge status: Home / Self Care | Payer: Medicaid Other | Attending: Emergency Medicine | Admitting: Emergency Medicine

## 2012-12-31 DIAGNOSIS — G8929 Other chronic pain: Secondary | ICD-10-CM | POA: Insufficient documentation

## 2012-12-31 DIAGNOSIS — J4521 Mild intermittent asthma with (acute) exacerbation: Secondary | ICD-10-CM

## 2012-12-31 DIAGNOSIS — Z880 Allergy status to penicillin: Secondary | ICD-10-CM | POA: Insufficient documentation

## 2012-12-31 DIAGNOSIS — Z79899 Other long term (current) drug therapy: Secondary | ICD-10-CM | POA: Insufficient documentation

## 2012-12-31 DIAGNOSIS — M549 Dorsalgia, unspecified: Secondary | ICD-10-CM | POA: Insufficient documentation

## 2012-12-31 DIAGNOSIS — I10 Essential (primary) hypertension: Secondary | ICD-10-CM | POA: Insufficient documentation

## 2012-12-31 DIAGNOSIS — Z87828 Personal history of other (healed) physical injury and trauma: Secondary | ICD-10-CM | POA: Insufficient documentation

## 2012-12-31 DIAGNOSIS — Z8669 Personal history of other diseases of the nervous system and sense organs: Secondary | ICD-10-CM | POA: Insufficient documentation

## 2012-12-31 DIAGNOSIS — J45901 Unspecified asthma with (acute) exacerbation: Secondary | ICD-10-CM | POA: Insufficient documentation

## 2012-12-31 LAB — CBC
HCT: 42.3 % (ref 39.0–52.0)
Hemoglobin: 15 g/dL (ref 13.0–17.0)
MCH: 31.3 pg (ref 26.0–34.0)
MCHC: 35.5 g/dL (ref 30.0–36.0)
MCV: 88.1 fL (ref 78.0–100.0)

## 2012-12-31 LAB — BASIC METABOLIC PANEL
BUN: 8 mg/dL (ref 6–23)
CO2: 28 mEq/L (ref 19–32)
Calcium: 9.9 mg/dL (ref 8.4–10.5)
GFR calc non Af Amer: >90 mL/min (ref >90)
Glucose, Bld: 106 mg/dL — ABNORMAL HIGH (ref 70–99)

## 2012-12-31 MED ORDER — ALBUTEROL SULFATE HFA 108 (90 BASE) MCG/ACT IN AERS: 1.0000 | INHALATION_SPRAY | Freq: Four times a day (QID) | RESPIRATORY_TRACT | Status: DC | PRN

## 2012-12-31 MED ORDER — IPRATROPIUM BROMIDE 0.02 % IN SOLN
0.5000 mg | Freq: Once | RESPIRATORY_TRACT | Status: AC
  Administered 2012-12-31: 0.5 mg via RESPIRATORY_TRACT
  Filled 2012-12-31: qty 2.5

## 2012-12-31 MED ORDER — ALBUTEROL SULFATE (5 MG/ML) 0.5% IN NEBU
5.0000 mg | INHALATION_SOLUTION | Freq: Once | RESPIRATORY_TRACT | Status: AC
  Administered 2012-12-31: 5 mg via RESPIRATORY_TRACT
  Filled 2012-12-31: qty 1

## 2012-12-31 MED ORDER — ALBUTEROL SULFATE HFA 108 (90 BASE) MCG/ACT IN AERS
2.0000 | INHALATION_SPRAY | RESPIRATORY_TRACT | Status: DC | PRN
  Administered 2012-12-31: 2 via RESPIRATORY_TRACT
  Filled 2012-12-31: qty 6.7

## 2012-12-31 NOTE — ED Notes (Signed)
Pt sts he was told to continue to use his inhaler for pain upon d/c from [facility] x 2 days ago.  Sts he "ran out" of his inhaler.
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
## 2012-12-31 NOTE — ED Notes (Signed)
Patient transported to X-ray 

## 2012-12-31 NOTE — Progress Notes (Signed)
[facility] ED CM noted CM consult from ED RN and EDP  CM spoke with RN This was the incorrect pt for CM referral  CM referral acknowledged as discontinued

## 2012-12-31 NOTE — ED Notes (Signed)
PA at bedside.

## 2012-12-31 NOTE — ED Notes (Signed)
Patient reports feeling central chest pain suddenly at about 1200 today, with some dizziness. Patient denies diaphoresis, emesis, nausea, blurred vision.

## 2013-01-02 NOTE — ED Provider Notes (Signed)
Medical screening examination/treatment/procedure(s) were conducted as a shared visit with non-physician practitioner(s) and myself.  I personally evaluated the patient during the encounter.  History and physical consistent with asthma attack.  Patient feels much better after breathing treatment  Donnetta Hutching, MD 01/02/13 [phone]

## 2013-01-04 NOTE — Progress Notes (Signed)
CSW received consult for medication assistance. Patient dc before csw could assess or follow up with rn cm.   Catha Gosselin, LCSWA  [phone] .01/04/2013 1750pm

## 2013-01-20 ENCOUNTER — Emergency Department (HOSPITAL_COMMUNITY)
Admission: EM | Admit: 2013-01-20 | Discharge: 2013-01-20 | Disposition: A | Discharge status: Home / Self Care | Payer: Medicaid Other | Attending: Emergency Medicine | Admitting: Emergency Medicine

## 2013-01-20 ENCOUNTER — Encounter (HOSPITAL_COMMUNITY): Payer: Self-pay | Admitting: Emergency Medicine

## 2013-01-20 DIAGNOSIS — G8929 Other chronic pain: Secondary | ICD-10-CM | POA: Insufficient documentation

## 2013-01-20 DIAGNOSIS — F10920 Alcohol use, unspecified with intoxication, uncomplicated: Secondary | ICD-10-CM

## 2013-01-20 DIAGNOSIS — Z79899 Other long term (current) drug therapy: Secondary | ICD-10-CM | POA: Insufficient documentation

## 2013-01-20 DIAGNOSIS — F1010 Alcohol abuse, uncomplicated: Secondary | ICD-10-CM | POA: Insufficient documentation

## 2013-01-20 DIAGNOSIS — J45901 Unspecified asthma with (acute) exacerbation: Secondary | ICD-10-CM | POA: Insufficient documentation

## 2013-01-20 DIAGNOSIS — Z87828 Personal history of other (healed) physical injury and trauma: Secondary | ICD-10-CM | POA: Insufficient documentation

## 2013-01-20 DIAGNOSIS — I10 Essential (primary) hypertension: Secondary | ICD-10-CM | POA: Insufficient documentation

## 2013-01-20 DIAGNOSIS — Z8669 Personal history of other diseases of the nervous system and sense organs: Secondary | ICD-10-CM | POA: Insufficient documentation

## 2013-01-20 DIAGNOSIS — Z880 Allergy status to penicillin: Secondary | ICD-10-CM | POA: Insufficient documentation

## 2013-01-20 DIAGNOSIS — J4521 Mild intermittent asthma with (acute) exacerbation: Secondary | ICD-10-CM

## 2013-01-20 DIAGNOSIS — R000 Tachycardia, unspecified: Secondary | ICD-10-CM | POA: Insufficient documentation

## 2013-01-20 LAB — POCT I-STAT, CHEM 8
BUN: 5 mg/dL — ABNORMAL LOW (ref 6–23)
Calcium, Ion: 1.11 mmol/L — ABNORMAL LOW (ref 1.12–1.23)
Chloride: 103 mEq/L (ref 96–112)
Creatinine, Ser: 1.1 mg/dL (ref 0.50–1.35)
Glucose, Bld: 103 mg/dL — ABNORMAL HIGH (ref 70–99)
TCO2: 26 mmol/L (ref 0–100)

## 2013-01-20 LAB — POCT I-STAT TROPONIN I: Troponin i, poc: 0.01 ng/mL (ref 0.00–0.08)

## 2013-01-20 LAB — ETHANOL: Alcohol, Ethyl (B): 150 mg/dL — ABNORMAL HIGH (ref 0–11)

## 2013-01-20 MED ORDER — IPRATROPIUM BROMIDE 0.02 % IN SOLN
0.5000 mg | Freq: Once | RESPIRATORY_TRACT | Status: AC
  Administered 2013-01-20: 0.5 mg via RESPIRATORY_TRACT
  Filled 2013-01-20: qty 2.5

## 2013-01-20 MED ORDER — ALBUTEROL SULFATE HFA 108 (90 BASE) MCG/ACT IN AERS
2.0000 | INHALATION_SPRAY | RESPIRATORY_TRACT | Status: DC | PRN
  Administered 2013-01-20: 2 via RESPIRATORY_TRACT
  Filled 2013-01-20: qty 6.7

## 2013-01-20 MED ORDER — ALBUTEROL SULFATE (5 MG/ML) 0.5% IN NEBU
5.0000 mg | INHALATION_SOLUTION | Freq: Once | RESPIRATORY_TRACT | Status: AC
  Administered 2013-01-20: 5 mg via RESPIRATORY_TRACT
  Filled 2013-01-20: qty 1

## 2013-01-20 NOTE — ED Notes (Signed)
RT notified of pending neb tx

## 2013-01-20 NOTE — ED Notes (Addendum)
Per EMS. Patient from home with c/o chest pain onset at noon today, Left sided chest pain radiating to R arm. Patient has no other complaints at this time. Pain rated 6/10. EKG by EMS unremarkable, Sinus Tachycardia. Patient accompained by mother a this time. Patient took 4 ASA (dose unknown) at home prior to EMS arrival. Patient admits to drinking 2-40 ounces beers, although his quantity and time of ingestion have varied when talking to EMS.

## 2013-01-20 NOTE — ED Notes (Signed)
Pt removed from telemetry at his request.  Dr. Radford Pax aware and ok with pt remaining off telemetry

## 2013-01-20 NOTE — ED Notes (Signed)
AVW:[id number]<XB> Expected date:01/20/13<BR> Expected time:<BR> Means of arrival:<BR> Comments:<BR> ems
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
## 2013-01-20 NOTE — ED Notes (Signed)
Patient reports chest pain x2 days. Patient states tonight it became sharp. Patient admits to drinking 2-40 oz beers today, last at 1600. Patient is accompanied by his mother at this bedside.

## 2013-02-05 ENCOUNTER — Encounter: Payer: Medicaid Other | Admitting: Family Medicine

## 2013-02-09 ENCOUNTER — Telehealth: Payer: Self-pay | Admitting: Family Medicine

## 2013-02-09 NOTE — Telephone Encounter (Signed)
Please let patient can take over the counter Robitussin for his cough. If that is not working for him, we will need to see him in clinic to listen to his lungs and make sure there is not something more serious going on.  Thanks, [employer]. Florian Chauca, M.D.

## 2013-02-09 NOTE — Telephone Encounter (Signed)
Pt is requesting the Dr. Mikel Cella call in some cough syrup to [facility] on [address]. JW

## 2013-02-09 NOTE — Telephone Encounter (Signed)
Pt informed and agreeable. Antonio Lane  

## 2013-02-16 ENCOUNTER — Encounter (HOSPITAL_COMMUNITY): Payer: Self-pay | Admitting: *Deleted

## 2013-02-16 ENCOUNTER — Emergency Department (HOSPITAL_COMMUNITY)
Admission: EM | Admit: 2013-02-16 | Discharge: 2013-02-17 | Disposition: A | Discharge status: Home / Self Care | Payer: Medicaid Other | Attending: Emergency Medicine | Admitting: Emergency Medicine

## 2013-02-16 DIAGNOSIS — W19XXXA Unspecified fall, initial encounter: Secondary | ICD-10-CM

## 2013-02-16 DIAGNOSIS — J45909 Unspecified asthma, uncomplicated: Secondary | ICD-10-CM | POA: Insufficient documentation

## 2013-02-16 DIAGNOSIS — Z7982 Long term (current) use of aspirin: Secondary | ICD-10-CM | POA: Insufficient documentation

## 2013-02-16 DIAGNOSIS — R42 Dizziness and giddiness: Secondary | ICD-10-CM | POA: Insufficient documentation

## 2013-02-16 DIAGNOSIS — IMO0002 Reserved for concepts with insufficient information to code with codable children: Secondary | ICD-10-CM | POA: Insufficient documentation

## 2013-02-16 DIAGNOSIS — I10 Essential (primary) hypertension: Secondary | ICD-10-CM | POA: Insufficient documentation

## 2013-02-16 DIAGNOSIS — W1809XA Striking against other object with subsequent fall, initial encounter: Secondary | ICD-10-CM | POA: Insufficient documentation

## 2013-02-16 DIAGNOSIS — Y929 Unspecified place or not applicable: Secondary | ICD-10-CM | POA: Insufficient documentation

## 2013-02-16 DIAGNOSIS — M545 Low back pain: Secondary | ICD-10-CM

## 2013-02-16 DIAGNOSIS — Y939 Activity, unspecified: Secondary | ICD-10-CM | POA: Insufficient documentation

## 2013-02-16 DIAGNOSIS — Z880 Allergy status to penicillin: Secondary | ICD-10-CM | POA: Insufficient documentation

## 2013-02-16 DIAGNOSIS — S20219A Contusion of unspecified front wall of thorax, initial encounter: Secondary | ICD-10-CM

## 2013-02-16 DIAGNOSIS — G8929 Other chronic pain: Secondary | ICD-10-CM | POA: Insufficient documentation

## 2013-02-16 NOTE — ED Notes (Signed)
Patient is alert and oriented x3.  He states that he has been having dizzy spells for three days. Today began to feel dizzy and fell on a rock to the mid chest.

## 2013-02-17 ENCOUNTER — Emergency Department (HOSPITAL_COMMUNITY): Payer: Medicaid Other

## 2013-02-17 MED ORDER — CYCLOBENZAPRINE HCL 10 MG PO TABS: 10.0000 mg | ORAL_TABLET | Freq: Two times a day (BID) | ORAL | Status: DC | PRN

## 2013-02-17 MED ORDER — NAPROXEN 500 MG PO TABS: 500.0000 mg | ORAL_TABLET | Freq: Two times a day (BID) | ORAL | Status: DC
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
## 2013-02-25 ENCOUNTER — Encounter (HOSPITAL_COMMUNITY): Payer: Self-pay | Admitting: Adult Health

## 2013-02-25 ENCOUNTER — Emergency Department (HOSPITAL_COMMUNITY)
Admission: EM | Admit: 2013-02-25 | Discharge: 2013-02-25 | Disposition: A | Discharge status: Home / Self Care | Payer: Medicaid Other | Attending: Emergency Medicine | Admitting: Emergency Medicine

## 2013-02-25 DIAGNOSIS — J069 Acute upper respiratory infection, unspecified: Secondary | ICD-10-CM | POA: Insufficient documentation

## 2013-02-25 DIAGNOSIS — J329 Chronic sinusitis, unspecified: Secondary | ICD-10-CM | POA: Insufficient documentation

## 2013-02-25 DIAGNOSIS — Z7982 Long term (current) use of aspirin: Secondary | ICD-10-CM | POA: Insufficient documentation

## 2013-02-25 DIAGNOSIS — R630 Anorexia: Secondary | ICD-10-CM | POA: Insufficient documentation

## 2013-02-25 DIAGNOSIS — Z880 Allergy status to penicillin: Secondary | ICD-10-CM | POA: Insufficient documentation

## 2013-02-25 DIAGNOSIS — IMO0002 Reserved for concepts with insufficient information to code with codable children: Secondary | ICD-10-CM | POA: Insufficient documentation

## 2013-02-25 DIAGNOSIS — H53149 Visual discomfort, unspecified: Secondary | ICD-10-CM | POA: Insufficient documentation

## 2013-02-25 DIAGNOSIS — R1310 Dysphagia, unspecified: Secondary | ICD-10-CM | POA: Insufficient documentation

## 2013-02-25 DIAGNOSIS — R5381 Other malaise: Secondary | ICD-10-CM | POA: Insufficient documentation

## 2013-02-25 DIAGNOSIS — R059 Cough, unspecified: Secondary | ICD-10-CM | POA: Insufficient documentation

## 2013-02-25 DIAGNOSIS — J3489 Other specified disorders of nose and nasal sinuses: Secondary | ICD-10-CM | POA: Insufficient documentation

## 2013-02-25 DIAGNOSIS — H9209 Otalgia, unspecified ear: Secondary | ICD-10-CM | POA: Insufficient documentation

## 2013-02-25 DIAGNOSIS — R05 Cough: Secondary | ICD-10-CM | POA: Insufficient documentation

## 2013-02-25 DIAGNOSIS — Z79899 Other long term (current) drug therapy: Secondary | ICD-10-CM | POA: Insufficient documentation

## 2013-02-25 LAB — RAPID STREP SCREEN (MED CTR MEBANE ONLY): Streptococcus, Group A Screen (Direct): NEGATIVE

## 2013-02-25 MED ORDER — BENZOCAINE 20 % MT SOLN: 1.0000 "application " | Freq: Three times a day (TID) | OROMUCOSAL | Status: DC | PRN

## 2013-02-25 MED ORDER — ALBUTEROL SULFATE HFA 108 (90 BASE) MCG/ACT IN AERS
2.0000 | INHALATION_SPRAY | Freq: Once | RESPIRATORY_TRACT | Status: AC
  Administered 2013-02-25: 2 via RESPIRATORY_TRACT
  Filled 2013-02-25: qty 6.7

## 2013-02-25 MED ORDER — HYDROCODONE-HOMATROPINE 5-1.5 MG/5ML PO SYRP: 5.0000 mL | ORAL_SOLUTION | Freq: Four times a day (QID) | ORAL | Status: DC | PRN

## 2013-02-25 MED ORDER — IBUPROFEN 400 MG PO TABS
800.0000 mg | ORAL_TABLET | Freq: Once | ORAL | Status: AC
  Administered 2013-02-25: 800 mg via ORAL
  Filled 2013-02-25: qty 2

## 2013-02-25 MED ORDER — GUAIFENESIN ER 600 MG PO TB12: 1200.0000 mg | ORAL_TABLET | Freq: Two times a day (BID) | ORAL | Status: DC

## 2013-02-25 MED ORDER — FLUTICASONE PROPIONATE 50 MCG/ACT NA SUSP: 2.0000 | Freq: Every day | NASAL | Status: DC

## 2013-02-25 NOTE — ED Notes (Signed)
Presents with 3 days of sore throat and pain with swallowing. Denies fever, reports cough. Throat red.
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
## 2013-02-26 NOTE — ED Provider Notes (Signed)
Medical screening examination/treatment/procedure(s) were performed by non-physician practitioner and as supervising physician I was immediately available for consultation/collaboration.   [name]. Oree Mirelez, MD 02/26/13 1455

## 2013-02-27 LAB — CULTURE, GROUP A STREP

## 2013-03-09 ENCOUNTER — Encounter: Payer: Medicaid Other | Admitting: Family Medicine

## 2013-03-17 ENCOUNTER — Emergency Department (HOSPITAL_COMMUNITY)
Admission: EM | Admit: 2013-03-17 | Discharge: 2013-03-17 | Disposition: A | Discharge status: Home / Self Care | Payer: Medicaid Other | Attending: Emergency Medicine | Admitting: Emergency Medicine

## 2013-03-17 ENCOUNTER — Encounter (HOSPITAL_COMMUNITY): Payer: Self-pay | Admitting: Emergency Medicine

## 2013-03-17 DIAGNOSIS — Z7982 Long term (current) use of aspirin: Secondary | ICD-10-CM | POA: Insufficient documentation

## 2013-03-17 DIAGNOSIS — M549 Dorsalgia, unspecified: Secondary | ICD-10-CM | POA: Insufficient documentation

## 2013-03-17 DIAGNOSIS — Z87828 Personal history of other (healed) physical injury and trauma: Secondary | ICD-10-CM | POA: Insufficient documentation

## 2013-03-17 DIAGNOSIS — Z880 Allergy status to penicillin: Secondary | ICD-10-CM | POA: Insufficient documentation

## 2013-03-17 DIAGNOSIS — R300 Dysuria: Secondary | ICD-10-CM | POA: Insufficient documentation

## 2013-03-17 DIAGNOSIS — J45909 Unspecified asthma, uncomplicated: Secondary | ICD-10-CM | POA: Insufficient documentation

## 2013-03-17 DIAGNOSIS — IMO0002 Reserved for concepts with insufficient information to code with codable children: Secondary | ICD-10-CM | POA: Insufficient documentation

## 2013-03-17 DIAGNOSIS — Z79899 Other long term (current) drug therapy: Secondary | ICD-10-CM | POA: Insufficient documentation

## 2013-03-17 DIAGNOSIS — R319 Hematuria, unspecified: Secondary | ICD-10-CM | POA: Insufficient documentation

## 2013-03-17 DIAGNOSIS — R1110 Vomiting, unspecified: Secondary | ICD-10-CM | POA: Insufficient documentation

## 2013-03-17 DIAGNOSIS — R109 Unspecified abdominal pain: Secondary | ICD-10-CM | POA: Insufficient documentation

## 2013-03-17 DIAGNOSIS — G8929 Other chronic pain: Secondary | ICD-10-CM | POA: Insufficient documentation

## 2013-03-17 DIAGNOSIS — Z8669 Personal history of other diseases of the nervous system and sense organs: Secondary | ICD-10-CM | POA: Insufficient documentation

## 2013-03-17 DIAGNOSIS — I10 Essential (primary) hypertension: Secondary | ICD-10-CM | POA: Insufficient documentation

## 2013-03-17 LAB — URINALYSIS, ROUTINE W REFLEX MICROSCOPIC
Ketones, ur: NEGATIVE mg/dL
Leukocytes, UA: NEGATIVE
Nitrite: NEGATIVE
Protein, ur: NEGATIVE mg/dL
Urobilinogen, UA: 0.2 mg/dL (ref 0.0–1.0)

## 2013-03-17 MED ORDER — IBUPROFEN 800 MG PO TABS
800.0000 mg | ORAL_TABLET | Freq: Once | ORAL | Status: AC
  Administered 2013-03-17: 800 mg via ORAL
  Filled 2013-03-17: qty 1

## 2013-03-17 NOTE — ED Notes (Signed)
Pt states he had hematuria and abdominal pain x3 days. Pt has history of kidney stones and states this feels like his previous episodes of kidney stones. Pt admits to drinking alcohol today.
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
## 2013-04-06 ENCOUNTER — Encounter (HOSPITAL_COMMUNITY): Payer: Self-pay | Admitting: Emergency Medicine

## 2013-04-06 ENCOUNTER — Emergency Department (HOSPITAL_COMMUNITY)
Admission: EM | Admit: 2013-04-06 | Discharge: 2013-04-06 | Discharge status: Against Medical Advice | Payer: Medicaid Other | Attending: Emergency Medicine | Admitting: Emergency Medicine

## 2013-04-06 DIAGNOSIS — K649 Unspecified hemorrhoids: Secondary | ICD-10-CM | POA: Insufficient documentation

## 2013-04-06 DIAGNOSIS — F17200 Nicotine dependence, unspecified, uncomplicated: Secondary | ICD-10-CM | POA: Insufficient documentation

## 2013-04-06 NOTE — ED Notes (Signed)
Called without response 

## 2013-04-06 NOTE — ED Notes (Signed)
Pt. Reports painful hemorrhoids with rectal bleeding onset today .

## 2013-04-24 ENCOUNTER — Emergency Department (HOSPITAL_COMMUNITY)
Admission: EM | Admit: 2013-04-24 | Discharge: 2013-04-24 | Disposition: A | Discharge status: Home / Self Care | Payer: Medicaid Other | Attending: Emergency Medicine | Admitting: Emergency Medicine

## 2013-04-24 ENCOUNTER — Encounter (HOSPITAL_COMMUNITY): Payer: Self-pay

## 2013-04-24 DIAGNOSIS — M62830 Muscle spasm of back: Secondary | ICD-10-CM

## 2013-04-24 DIAGNOSIS — M5380 Other specified dorsopathies, site unspecified: Secondary | ICD-10-CM | POA: Insufficient documentation

## 2013-04-24 DIAGNOSIS — G8929 Other chronic pain: Secondary | ICD-10-CM | POA: Insufficient documentation

## 2013-04-24 DIAGNOSIS — J45909 Unspecified asthma, uncomplicated: Secondary | ICD-10-CM | POA: Insufficient documentation

## 2013-04-24 DIAGNOSIS — M542 Cervicalgia: Secondary | ICD-10-CM | POA: Insufficient documentation

## 2013-04-24 DIAGNOSIS — Z87828 Personal history of other (healed) physical injury and trauma: Secondary | ICD-10-CM | POA: Insufficient documentation

## 2013-04-24 DIAGNOSIS — Z880 Allergy status to penicillin: Secondary | ICD-10-CM | POA: Insufficient documentation

## 2013-04-24 DIAGNOSIS — IMO0002 Reserved for concepts with insufficient information to code with codable children: Secondary | ICD-10-CM | POA: Insufficient documentation

## 2013-04-24 DIAGNOSIS — Z79899 Other long term (current) drug therapy: Secondary | ICD-10-CM | POA: Insufficient documentation

## 2013-04-24 DIAGNOSIS — I10 Essential (primary) hypertension: Secondary | ICD-10-CM | POA: Insufficient documentation

## 2013-04-24 MED ORDER — DIAZEPAM 5 MG PO TABS
5.0000 mg | ORAL_TABLET | Freq: Once | ORAL | Status: AC
  Administered 2013-04-24: 5 mg via ORAL
  Filled 2013-04-24: qty 1

## 2013-04-24 MED ORDER — IBUPROFEN 600 MG PO TABS: 600.0000 mg | ORAL_TABLET | Freq: Four times a day (QID) | ORAL | Status: DC | PRN

## 2013-04-24 MED ORDER — DIAZEPAM 5 MG PO TABS: 5.0000 mg | ORAL_TABLET | Freq: Two times a day (BID) | ORAL | Status: DC
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
## 2013-04-24 NOTE — ED Provider Notes (Signed)
  Medical screening examination/treatment/procedure(s) were performed by non-physician practitioner and as supervising physician I was immediately available for consultation/collaboration.    Tamzin Bertling, MD 04/24/13 2323 

## 2013-04-24 NOTE — ED Notes (Signed)
Pt states he was helping his aunt move 2 days ago and felt his back pull.  C/O back and neck pain

## 2013-04-26 ENCOUNTER — Ambulatory Visit: Payer: Medicaid Other | Admitting: Family Medicine

## 2013-05-14 ENCOUNTER — Emergency Department (HOSPITAL_COMMUNITY)
Admission: EM | Admit: 2013-05-14 | Discharge: 2013-05-15 | Disposition: A | Discharge status: Home / Self Care | Payer: Medicaid Other | Attending: Emergency Medicine | Admitting: Emergency Medicine

## 2013-05-14 ENCOUNTER — Emergency Department (HOSPITAL_COMMUNITY): Payer: Medicaid Other

## 2013-05-14 ENCOUNTER — Encounter (HOSPITAL_COMMUNITY): Payer: Self-pay | Admitting: Emergency Medicine

## 2013-05-14 DIAGNOSIS — Z880 Allergy status to penicillin: Secondary | ICD-10-CM | POA: Insufficient documentation

## 2013-05-14 DIAGNOSIS — G8929 Other chronic pain: Secondary | ICD-10-CM | POA: Insufficient documentation

## 2013-05-14 DIAGNOSIS — S93409A Sprain of unspecified ligament of unspecified ankle, initial encounter: Secondary | ICD-10-CM | POA: Insufficient documentation

## 2013-05-14 DIAGNOSIS — S96911A Strain of unspecified muscle and tendon at ankle and foot level, right foot, initial encounter: Secondary | ICD-10-CM

## 2013-05-14 DIAGNOSIS — R296 Repeated falls: Secondary | ICD-10-CM | POA: Insufficient documentation

## 2013-05-14 DIAGNOSIS — IMO0002 Reserved for concepts with insufficient information to code with codable children: Secondary | ICD-10-CM | POA: Insufficient documentation

## 2013-05-14 DIAGNOSIS — Y939 Activity, unspecified: Secondary | ICD-10-CM | POA: Insufficient documentation

## 2013-05-14 DIAGNOSIS — X500XXA Overexertion from strenuous movement or load, initial encounter: Secondary | ICD-10-CM | POA: Insufficient documentation

## 2013-05-14 DIAGNOSIS — Z79899 Other long term (current) drug therapy: Secondary | ICD-10-CM | POA: Insufficient documentation

## 2013-05-14 DIAGNOSIS — Z87828 Personal history of other (healed) physical injury and trauma: Secondary | ICD-10-CM | POA: Insufficient documentation

## 2013-05-14 DIAGNOSIS — I10 Essential (primary) hypertension: Secondary | ICD-10-CM | POA: Insufficient documentation

## 2013-05-14 DIAGNOSIS — Y929 Unspecified place or not applicable: Secondary | ICD-10-CM | POA: Insufficient documentation

## 2013-05-14 DIAGNOSIS — J45909 Unspecified asthma, uncomplicated: Secondary | ICD-10-CM | POA: Insufficient documentation

## 2013-05-14 NOTE — ED Notes (Signed)
Pt upset that it is taking so long for his xray.
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
## 2013-05-14 NOTE — ED Notes (Signed)
Pt to xray at this time via wheelchair.

## 2013-05-14 NOTE — ED Notes (Signed)
Presents with right ankle injury that occurred at 5 pm this evening after falling into a hole in ground while walking. Right ankle swelling. CMS intact.

## 2013-05-15 MED ORDER — HYDROCODONE-ACETAMINOPHEN 5-325 MG PO TABS: 1.0000 | ORAL_TABLET | Freq: Four times a day (QID) | ORAL | Status: DC | PRN

## 2013-05-15 NOTE — ED Provider Notes (Signed)
Medical screening examination/treatment/procedure(s) were performed by non-physician practitioner and as supervising physician I was immediately available for consultation/collaboration.  Lyanne Co, MD 05/15/13 [phone]

## 2013-05-15 NOTE — Progress Notes (Signed)
Orthopedic Tech Progress Note Patient Details:  Antonio Lane April 14, 1981 [id number]  Ortho Devices Type of Ortho Device: ASO Ortho Device/Splint Location: right LE Ortho Device/Splint Interventions: Application   [name] 05/15/2013, 12:35 AM

## 2013-05-15 NOTE — Discharge Instructions (Signed)

## 2013-05-20 ENCOUNTER — Encounter (HOSPITAL_COMMUNITY): Payer: Self-pay | Admitting: Emergency Medicine

## 2013-05-20 ENCOUNTER — Emergency Department (HOSPITAL_COMMUNITY)
Admission: EM | Admit: 2013-05-20 | Discharge: 2013-05-20 | Discharge status: Against Medical Advice | Payer: Medicaid Other | Attending: Emergency Medicine | Admitting: Emergency Medicine

## 2013-05-20 DIAGNOSIS — I10 Essential (primary) hypertension: Secondary | ICD-10-CM | POA: Insufficient documentation

## 2013-05-20 DIAGNOSIS — W172XXA Fall into hole, initial encounter: Secondary | ICD-10-CM | POA: Insufficient documentation

## 2013-05-20 DIAGNOSIS — Y929 Unspecified place or not applicable: Secondary | ICD-10-CM | POA: Insufficient documentation

## 2013-05-20 DIAGNOSIS — S8990XA Unspecified injury of unspecified lower leg, initial encounter: Secondary | ICD-10-CM | POA: Insufficient documentation

## 2013-05-20 DIAGNOSIS — J45909 Unspecified asthma, uncomplicated: Secondary | ICD-10-CM | POA: Insufficient documentation

## 2013-05-20 DIAGNOSIS — Y9389 Activity, other specified: Secondary | ICD-10-CM | POA: Insufficient documentation
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
## 2013-05-20 NOTE — ED Notes (Signed)
Pt informed of risk of leaving. Pt states he has to go. [employer] informed of pt's suspected intoxication to ensure pt was not driving upon departure from department.

## 2013-05-20 NOTE — ED Notes (Signed)
Pt fell into a hole and now has c/o right ankle pain. He is ambulatory and steady gait without assistance. States he has drank one beer today

## 2013-07-06 ENCOUNTER — Emergency Department (HOSPITAL_COMMUNITY)
Admission: EM | Admit: 2013-07-06 | Discharge: 2013-07-07 | Disposition: A | Discharge status: Home / Self Care | Payer: Medicaid Other | Attending: Emergency Medicine | Admitting: Emergency Medicine

## 2013-07-06 ENCOUNTER — Encounter (HOSPITAL_COMMUNITY): Payer: Self-pay | Admitting: Emergency Medicine

## 2013-07-06 DIAGNOSIS — G8929 Other chronic pain: Secondary | ICD-10-CM | POA: Insufficient documentation

## 2013-07-06 DIAGNOSIS — J45909 Unspecified asthma, uncomplicated: Secondary | ICD-10-CM | POA: Insufficient documentation

## 2013-07-06 DIAGNOSIS — IMO0002 Reserved for concepts with insufficient information to code with codable children: Secondary | ICD-10-CM | POA: Insufficient documentation

## 2013-07-06 DIAGNOSIS — Z79899 Other long term (current) drug therapy: Secondary | ICD-10-CM | POA: Insufficient documentation

## 2013-07-06 DIAGNOSIS — Z87828 Personal history of other (healed) physical injury and trauma: Secondary | ICD-10-CM | POA: Insufficient documentation

## 2013-07-06 DIAGNOSIS — Z880 Allergy status to penicillin: Secondary | ICD-10-CM | POA: Insufficient documentation

## 2013-07-06 DIAGNOSIS — J069 Acute upper respiratory infection, unspecified: Secondary | ICD-10-CM | POA: Insufficient documentation
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
## 2013-07-06 NOTE — ED Notes (Addendum)
Pt reports for one day having congestion in chest, cough; denies sob, n/v; pt has congested cough- reports yellow sputum

## 2013-07-07 MED ORDER — HYDROCOD POLST-CHLORPHEN POLST 10-8 MG/5ML PO LQCR
5.0000 mL | Freq: Once | ORAL | Status: AC
  Administered 2013-07-07: 5 mL via ORAL
  Filled 2013-07-07: qty 5

## 2013-07-07 MED ORDER — HYDROCOD POLST-CHLORPHEN POLST 10-8 MG/5ML PO LQCR: 5.0000 mL | Freq: Two times a day (BID) | ORAL | Status: DC | PRN

## 2013-07-07 NOTE — ED Provider Notes (Signed)
Medical screening examination/treatment/procedure(s) were performed by non-physician practitioner and as supervising physician I was immediately available for consultation/collaboration.    Kyri Shader D Ladarren Steiner, MD 07/07/13 2342 

## 2013-07-15 ENCOUNTER — Emergency Department (HOSPITAL_COMMUNITY)
Admission: EM | Admit: 2013-07-15 | Discharge: 2013-07-15 | Disposition: A | Discharge status: Home / Self Care | Payer: Medicaid Other | Attending: Emergency Medicine | Admitting: Emergency Medicine

## 2013-07-15 ENCOUNTER — Encounter (HOSPITAL_COMMUNITY): Payer: Self-pay | Admitting: Emergency Medicine

## 2013-07-15 DIAGNOSIS — Z8669 Personal history of other diseases of the nervous system and sense organs: Secondary | ICD-10-CM | POA: Insufficient documentation

## 2013-07-15 DIAGNOSIS — Z79899 Other long term (current) drug therapy: Secondary | ICD-10-CM | POA: Insufficient documentation

## 2013-07-15 DIAGNOSIS — H9190 Unspecified hearing loss, unspecified ear: Secondary | ICD-10-CM | POA: Insufficient documentation

## 2013-07-15 DIAGNOSIS — Z880 Allergy status to penicillin: Secondary | ICD-10-CM | POA: Insufficient documentation

## 2013-07-15 DIAGNOSIS — J45909 Unspecified asthma, uncomplicated: Secondary | ICD-10-CM | POA: Insufficient documentation

## 2013-07-15 DIAGNOSIS — J3489 Other specified disorders of nose and nasal sinuses: Secondary | ICD-10-CM | POA: Insufficient documentation

## 2013-07-15 DIAGNOSIS — Z8739 Personal history of other diseases of the musculoskeletal system and connective tissue: Secondary | ICD-10-CM | POA: Insufficient documentation

## 2013-07-15 DIAGNOSIS — H9202 Otalgia, left ear: Secondary | ICD-10-CM

## 2013-07-15 DIAGNOSIS — Z87828 Personal history of other (healed) physical injury and trauma: Secondary | ICD-10-CM | POA: Insufficient documentation

## 2013-07-15 DIAGNOSIS — H9209 Otalgia, unspecified ear: Secondary | ICD-10-CM | POA: Insufficient documentation

## 2013-07-15 MED ORDER — ANTIPYRINE-BENZOCAINE 5.4-1.4 % OT SOLN: 3.0000 [drp] | OTIC | Status: DC | PRN
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
## 2013-07-15 NOTE — ED Notes (Signed)
Pt reports left ear pain x 3 days and lower back pain 10/10. Ambulatory without distress.

## 2013-07-15 NOTE — ED Notes (Signed)
Pt. reported that he lost his balance and fell last night , also stated that he can't hear from his left ear , slight left lower back pain , denies hematuria , ambulatory.

## 2013-07-15 NOTE — ED Notes (Signed)
Pt wanting to leave AMA; pt reassured PA to see soon.

## 2013-07-16 NOTE — ED Provider Notes (Signed)
Medical screening examination/treatment/procedure(s) were performed by non-physician practitioner and as supervising physician I was immediately available for consultation/collaboration.  Hurman Horn, MD 07/16/13 2027

## 2013-07-26 NOTE — ED Provider Notes (Signed)
Medical screening examination/treatment/procedure(s) were performed by non-physician practitioner and as supervising physician I was immediately available for consultation/collaboration.   Hurman Horn, MD 07/26/13 [phone]

## 2013-08-11 ENCOUNTER — Emergency Department (HOSPITAL_COMMUNITY)
Admission: EM | Admit: 2013-08-11 | Discharge: 2013-08-12 | Disposition: A | Discharge status: Home / Self Care | Payer: Medicaid Other | Attending: Emergency Medicine | Admitting: Emergency Medicine

## 2013-08-11 ENCOUNTER — Encounter (HOSPITAL_COMMUNITY): Payer: Self-pay | Admitting: Emergency Medicine

## 2013-08-11 DIAGNOSIS — W010XXA Fall on same level from slipping, tripping and stumbling without subsequent striking against object, initial encounter: Secondary | ICD-10-CM | POA: Insufficient documentation

## 2013-08-11 DIAGNOSIS — S20229A Contusion of unspecified back wall of thorax, initial encounter: Secondary | ICD-10-CM | POA: Insufficient documentation

## 2013-08-11 DIAGNOSIS — Y92009 Unspecified place in unspecified non-institutional (private) residence as the place of occurrence of the external cause: Secondary | ICD-10-CM | POA: Insufficient documentation

## 2013-08-11 DIAGNOSIS — Y93E1 Activity, personal bathing and showering: Secondary | ICD-10-CM | POA: Insufficient documentation

## 2013-08-11 DIAGNOSIS — J45909 Unspecified asthma, uncomplicated: Secondary | ICD-10-CM | POA: Insufficient documentation

## 2013-08-11 DIAGNOSIS — M549 Dorsalgia, unspecified: Secondary | ICD-10-CM | POA: Insufficient documentation

## 2013-08-11 DIAGNOSIS — Z880 Allergy status to penicillin: Secondary | ICD-10-CM | POA: Insufficient documentation

## 2013-08-11 DIAGNOSIS — Z79899 Other long term (current) drug therapy: Secondary | ICD-10-CM | POA: Insufficient documentation

## 2013-08-11 DIAGNOSIS — G8929 Other chronic pain: Secondary | ICD-10-CM | POA: Insufficient documentation

## 2013-08-11 MED ORDER — IBUPROFEN 800 MG PO TABS
800.0000 mg | ORAL_TABLET | Freq: Once | ORAL | Status: AC
  Administered 2013-08-12: 800 mg via ORAL
  Filled 2013-08-11: qty 1

## 2013-08-11 MED ORDER — IBUPROFEN 800 MG PO TABS: 800.0000 mg | ORAL_TABLET | Freq: Three times a day (TID) | ORAL | Status: DC

## 2013-08-11 NOTE — ED Notes (Signed)
Pt ambulatory to exam room with steady gait.  

## 2013-08-11 NOTE — ED Notes (Signed)
Pt states he was taking a bath and slipped on some soap and fell   Pt is c/o pain to the right side of his back  Pt has redness noted to his back
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
## 2013-08-12 NOTE — ED Provider Notes (Signed)
Medical screening examination/treatment/procedure(s) were performed by non-physician practitioner and as supervising physician I was immediately available for consultation/collaboration.    Yanni Ruberg M Junior Huezo, MD 08/12/13 0654 

## 2013-08-12 NOTE — Discharge Instructions (Signed)
Alternate heat and ice over your sore area. Followup with a primary care provider for continued evaluation and treatment.    Contusion A contusion is a deep bruise. Contusions are the result of an injury that caused bleeding under the skin. The contusion may turn blue, purple, or yellow. Minor injuries will give you a painless contusion, but more severe contusions may stay painful and swollen for a few weeks.  CAUSES  A contusion is usually caused by a blow, trauma, or direct force to an area of the body. SYMPTOMS   Swelling and redness of the injured area.  Bruising of the injured area.  Tenderness and soreness of the injured area.  Pain. DIAGNOSIS  The diagnosis can be made by taking a history and physical exam. An X-ray, CT scan, or MRI may be needed to determine if there were any associated injuries, such as fractures. TREATMENT  Specific treatment will depend on what area of the body was injured. In general, the best treatment for a contusion is resting, icing, elevating, and applying cold compresses to the injured area. Over-the-counter medicines may also be recommended for pain control. Ask your caregiver what the best treatment is for your contusion. HOME CARE INSTRUCTIONS   Put ice on the injured area.  Put ice in a plastic bag.  Place a towel between your skin and the bag.  Leave the ice on for 15-20 minutes, 03-04 times a day.  Only take over-the-counter or prescription medicines for pain, discomfort, or fever as directed by your caregiver. Your caregiver may recommend avoiding anti-inflammatory medicines (aspirin, ibuprofen, and naproxen) for 48 hours because these medicines may increase bruising.  Rest the injured area.  If possible, elevate the injured area to reduce swelling. SEEK IMMEDIATE MEDICAL CARE IF:   You have increased bruising or swelling.  You have pain that is getting worse.  Your swelling or pain is not relieved with medicines. MAKE SURE YOU:    Understand these instructions.  Will watch your condition.  Will get help right away if you are not doing well or get worse. Document Released: 04/24/2005 Document Revised: 10/07/2011 Document Reviewed: 05/20/2011 [facility] Patient Information 2014 [place], [place].

## 2013-08-18 ENCOUNTER — Emergency Department (HOSPITAL_COMMUNITY)
Admission: EM | Admit: 2013-08-18 | Discharge: 2013-08-18 | Discharge status: Against Medical Advice | Payer: Medicaid Other | Attending: Emergency Medicine | Admitting: Emergency Medicine

## 2013-08-18 DIAGNOSIS — J45909 Unspecified asthma, uncomplicated: Secondary | ICD-10-CM | POA: Insufficient documentation

## 2013-08-18 DIAGNOSIS — IMO0002 Reserved for concepts with insufficient information to code with codable children: Secondary | ICD-10-CM | POA: Insufficient documentation

## 2013-08-18 DIAGNOSIS — Y929 Unspecified place or not applicable: Secondary | ICD-10-CM | POA: Insufficient documentation

## 2013-08-18 DIAGNOSIS — Y939 Activity, unspecified: Secondary | ICD-10-CM | POA: Insufficient documentation

## 2013-08-18 DIAGNOSIS — W19XXXA Unspecified fall, initial encounter: Secondary | ICD-10-CM | POA: Insufficient documentation

## 2013-08-18 NOTE — ED Notes (Signed)
Pt has been called x 5.  Unable to locate.

## 2013-08-19 ENCOUNTER — Encounter (HOSPITAL_COMMUNITY): Payer: Self-pay | Admitting: Emergency Medicine

## 2013-08-19 ENCOUNTER — Emergency Department (HOSPITAL_COMMUNITY)
Admission: EM | Admit: 2013-08-19 | Discharge: 2013-08-19 | Discharge status: Against Medical Advice | Payer: Medicaid Other | Attending: Emergency Medicine | Admitting: Emergency Medicine

## 2013-08-19 DIAGNOSIS — M549 Dorsalgia, unspecified: Secondary | ICD-10-CM | POA: Insufficient documentation
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
## 2013-08-19 NOTE — ED Notes (Signed)
Called patient to be placed in triage 9. Pt not in lobby. Nurse first states she witnessed the patient leaving the facility.

## 2013-10-23 ENCOUNTER — Emergency Department (HOSPITAL_COMMUNITY)
Admission: EM | Admit: 2013-10-23 | Discharge: 2013-10-23 | Disposition: A | Discharge status: Home / Self Care | Payer: Medicaid Other | Attending: Emergency Medicine | Admitting: Emergency Medicine

## 2013-10-23 ENCOUNTER — Encounter (HOSPITAL_COMMUNITY): Payer: Self-pay | Admitting: Emergency Medicine

## 2013-10-23 DIAGNOSIS — J45909 Unspecified asthma, uncomplicated: Secondary | ICD-10-CM | POA: Insufficient documentation

## 2013-10-23 DIAGNOSIS — Z79899 Other long term (current) drug therapy: Secondary | ICD-10-CM | POA: Insufficient documentation

## 2013-10-23 DIAGNOSIS — G8929 Other chronic pain: Secondary | ICD-10-CM | POA: Insufficient documentation

## 2013-10-23 DIAGNOSIS — K029 Dental caries, unspecified: Secondary | ICD-10-CM | POA: Insufficient documentation

## 2013-10-23 DIAGNOSIS — Z87828 Personal history of other (healed) physical injury and trauma: Secondary | ICD-10-CM | POA: Insufficient documentation

## 2013-10-23 DIAGNOSIS — Z880 Allergy status to penicillin: Secondary | ICD-10-CM | POA: Insufficient documentation

## 2013-10-23 MED ORDER — NAPROXEN 500 MG PO TABS
500.0000 mg | ORAL_TABLET | Freq: Once | ORAL | Status: AC
  Administered 2013-10-23: 500 mg via ORAL
  Filled 2013-10-23: qty 1

## 2013-10-23 MED ORDER — CLINDAMYCIN HCL 300 MG PO CAPS: 300.0000 mg | ORAL_CAPSULE | Freq: Four times a day (QID) | ORAL | Status: AC

## 2013-10-23 MED ORDER — NAPROXEN 500 MG PO TABS: 500.0000 mg | ORAL_TABLET | Freq: Two times a day (BID) | ORAL | Status: AC

## 2013-10-23 NOTE — ED Notes (Signed)
Pt c/o dental pain x 1 day, upper back right side. Pt sates he thinks he may have an infection.

## 2013-10-23 NOTE — Discharge Instructions (Signed)
Please followup with a dentist on Monday for continued evaluation and treatment.    Dental Caries  Dental caries (also called tooth decay) is the most common oral disease. It can occur at any age, but is more common in children and young adults.  HOW DENTAL CARIES DEVELOPS  The process of decay begins when bacteria and foods (particularly sugars and starches) combine in your mouth to produce plaque. Plaque is a substance that sticks to the hard, outer surface of a tooth (enamel). The bacteria in plaque produce acids that attack enamel. These acids may also attack the root surface of a tooth (cementum) if it is exposed. Repeated attacks dissolve these surfaces and create holes in the tooth (cavities). If left untreated, the acids destroy the other layers of the tooth.  RISK FACTORS  Frequent sipping of sugary beverages.   Frequent snacking on sugary and starchy foods, especially those that easily get stuck in the teeth.   Poor oral hygiene.   Dry mouth.   Substance abuse such as methamphetamine abuse.   Broken or poor-fitting dental restorations.   Eating disorders.   Gastroesophageal reflux disease (GERD).   Certain radiation treatments to the head and neck. SYMPTOMS In the early stages of dental caries, symptoms are seldom present. Sometimes white, chalky areas may be seen on the enamel or other tooth layers. In later stages, symptoms may include:  Pits and holes on the enamel.  Toothache after sweet, hot, or cold foods or drinks are consumed.  Pain around the tooth.  Swelling around the tooth. DIAGNOSIS  Most of the time, dental caries is detected during a regular dental checkup. A diagnosis is made after a thorough medical and dental history is taken and the surfaces of your teeth are checked for signs of dental caries. Sometimes special instruments, such as lasers, are used to check for dental caries. Dental X-ray exams may be taken so that areas not visible to the  eye (such as between the contact areas of the teeth) can be checked for cavities.  TREATMENT  If dental caries is in its early stages, it may be reversed with a fluoride treatment or an application of a remineralizing agent at the dental office. Thorough brushing and flossing at home is needed to aid these treatments. If it is in its later stages, treatment depends on the location and extent of tooth destruction:   If a small area of the tooth has been destroyed, the destroyed area will be removed and cavities will be filled with a material such as gold, silver amalgam, or composite resin.   If a large area of the tooth has been destroyed, the destroyed area will be removed and a cap (crown) will be fitted over the remaining tooth structure.   If the center part of the tooth (pulp) is affected, a procedure called a root canal will be needed before a filling or crown can be placed.   If most of the tooth has been destroyed, the tooth may need to be pulled (extracted). HOME CARE INSTRUCTIONS You can prevent, stop, or reverse dental caries at home by practicing good oral hygiene. Good oral hygiene includes:  Thoroughly cleaning your teeth at least twice a day with a toothbrush and dental floss.   Using a fluoride toothpaste. A fluoride mouth rinse may also be used if recommended by your dentist or health care provider.   Restricting the amount of sugary and starchy foods and sugary liquids you consume.   Avoiding frequent  snacking on these foods and sipping of these liquids.   Keeping regular visits with a dentist for checkups and cleanings. PREVENTION   Practice good oral hygiene.  Consider a dental sealant. A dental sealant is a coating material that is applied by your dentist to the pits and grooves of teeth. The sealant prevents food from being trapped in them. It may protect the teeth for several years.  Ask about fluoride supplements if you live in a community without  fluorinated water or with water that has a low fluoride content. Use fluoride supplements as directed by your dentist or health care provider.  Allow fluoride varnish applications to teeth if directed by your dentist or health care provider. Document Released: 04/06/2002 Document Revised: 03/17/2013 Document Reviewed: 07/17/2012 [facility] Patient Information 2014 [place], [place].   Dental Pain A tooth ache may be caused by cavities (tooth decay). Cavities expose the nerve of the tooth to air and hot or cold temperatures. It may come from an infection or abscess (also called a boil or furuncle) around your tooth. It is also often caused by dental caries (tooth decay). This causes the pain you are having. DIAGNOSIS  Your caregiver can diagnose this problem by exam. TREATMENT   If caused by an infection, it may be treated with medications which kill germs (antibiotics) and pain medications as prescribed by your caregiver. Take medications as directed.  Only take over-the-counter or prescription medicines for pain, discomfort, or fever as directed by your caregiver.  Whether the tooth ache today is caused by infection or dental disease, you should see your dentist as soon as possible for further care. SEEK MEDICAL CARE IF: The exam and treatment you received today has been provided on an emergency basis only. This is not a substitute for complete medical or dental care. If your problem worsens or new problems (symptoms) appear, and you are unable to meet with your dentist, call or return to this location. SEEK IMMEDIATE MEDICAL CARE IF:   You have a fever.  You develop redness and swelling of your face, jaw, or neck.  You are unable to open your mouth.  You have severe pain uncontrolled by pain medicine. MAKE SURE YOU:   Understand these instructions.  Will watch your condition.  Will get help right away if you are not doing well or get worse. Document Released: 07/15/2005 Document  Revised: 10/07/2011 Document Reviewed: 03/02/2008 [facility] Patient Information 2014 [place], [place].
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
## 2013-10-24 NOTE — ED Provider Notes (Signed)
Medical screening examination/treatment/procedure(s) were performed by non-physician practitioner and as supervising physician I was immediately available for consultation/collaboration.    Jaegar Croft R Esly Selvage, MD 10/24/13 1812 

## 2014-12-18 IMAGING — CR DG LUMBAR SPINE COMPLETE 4+V
6 series · 6 of 6 positions shown · non-contrast
Comparison: CT 10/16/2006

CLINICAL DATA: Chronic low back pain without trauma.

LUMBAR SPINE - COMPLETE 4+ VIEW

[t l-spine a.p. (1 of 2)]
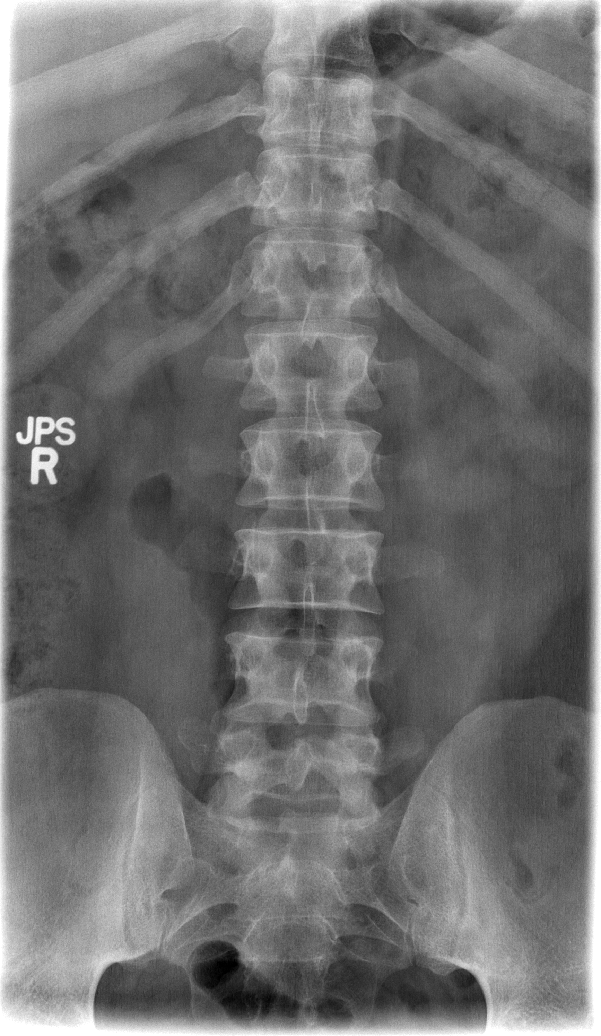

[t l-spine a.p. (2 of 2)]
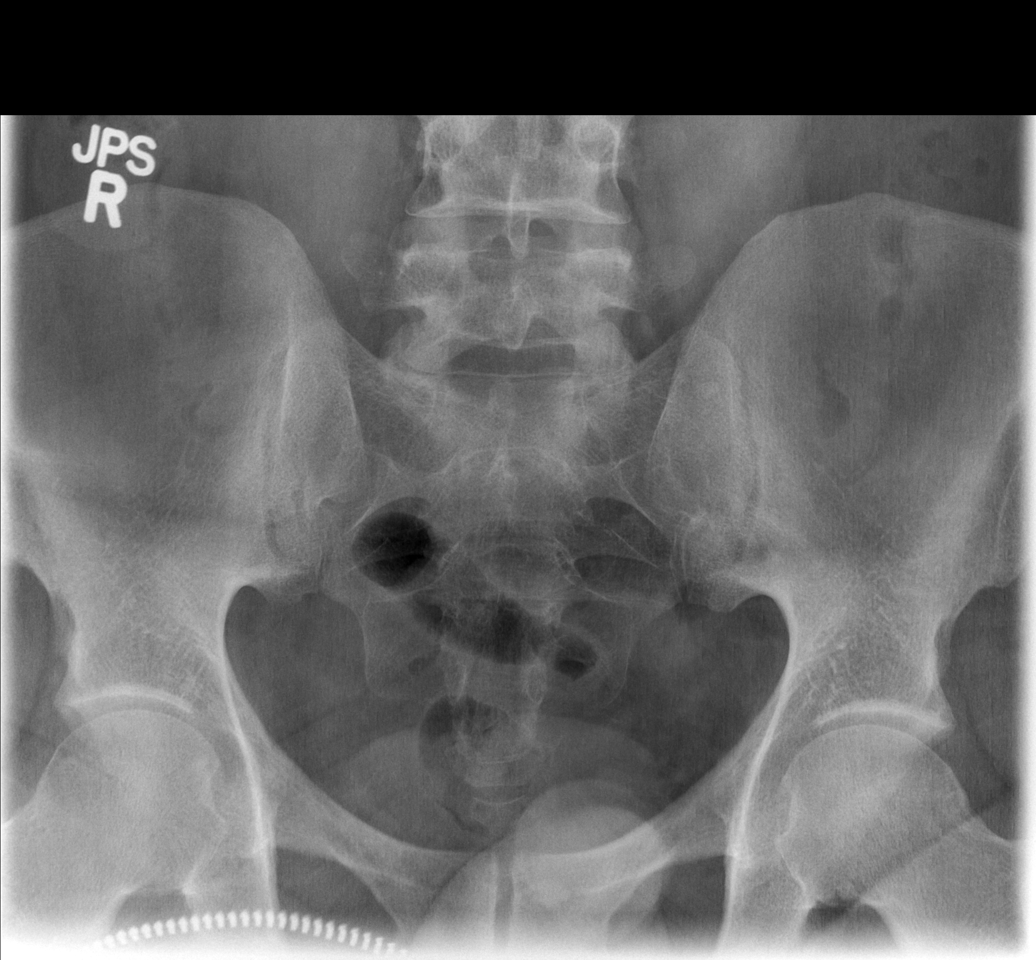

[t l-spine oblique exposure (1 of 2)]
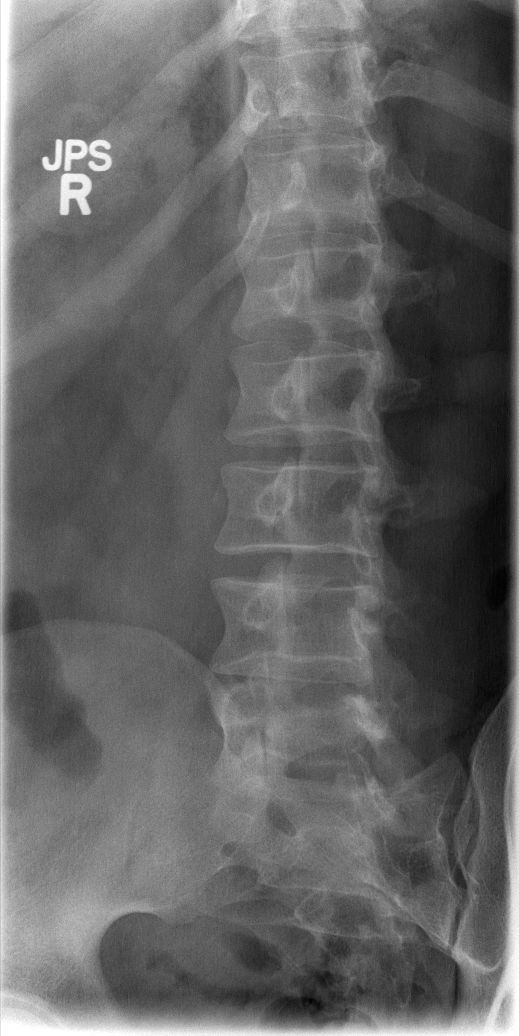

[t l-spine oblique exposure (2 of 2)]
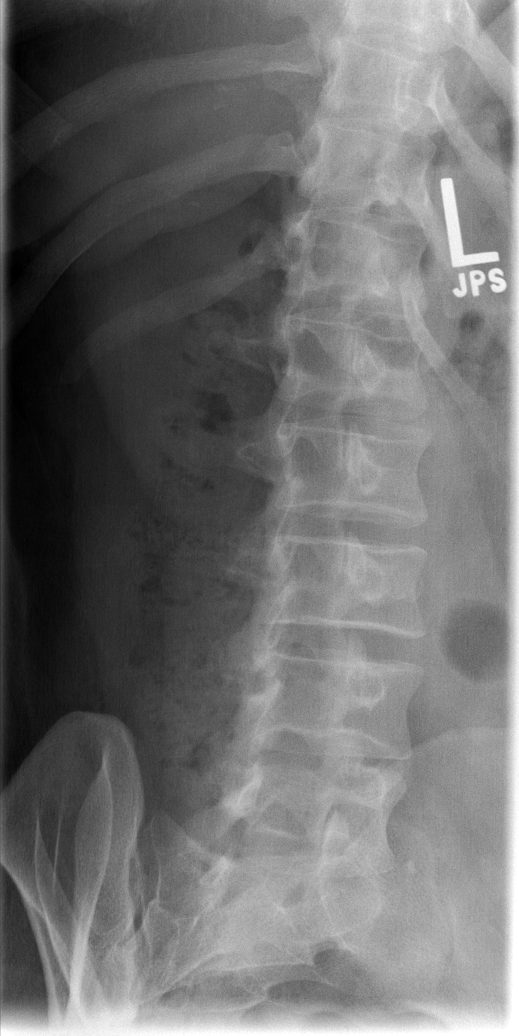

[t l-spine lat]
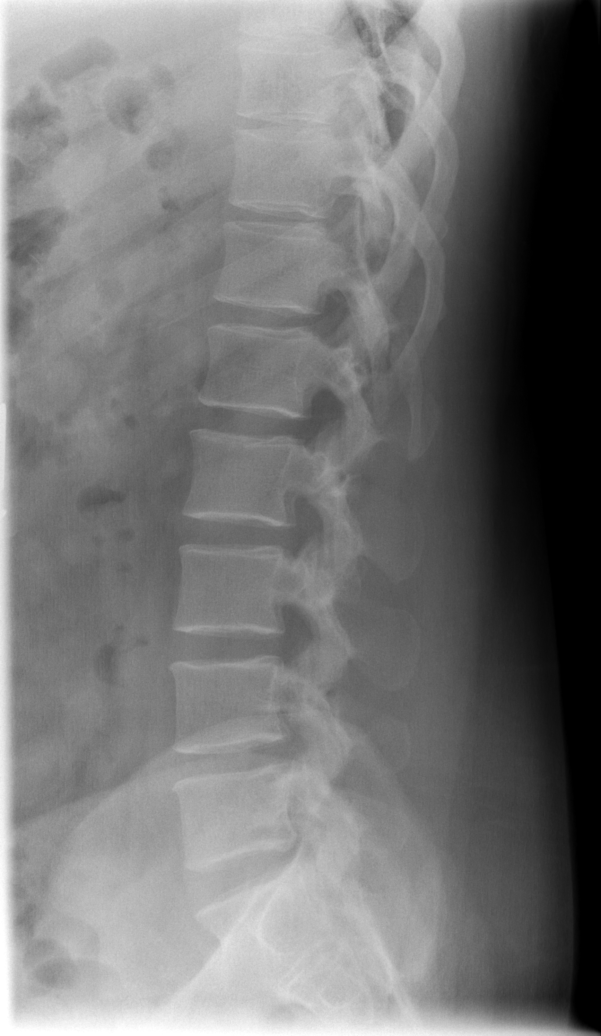

[t l-spine l5-s1 spot]
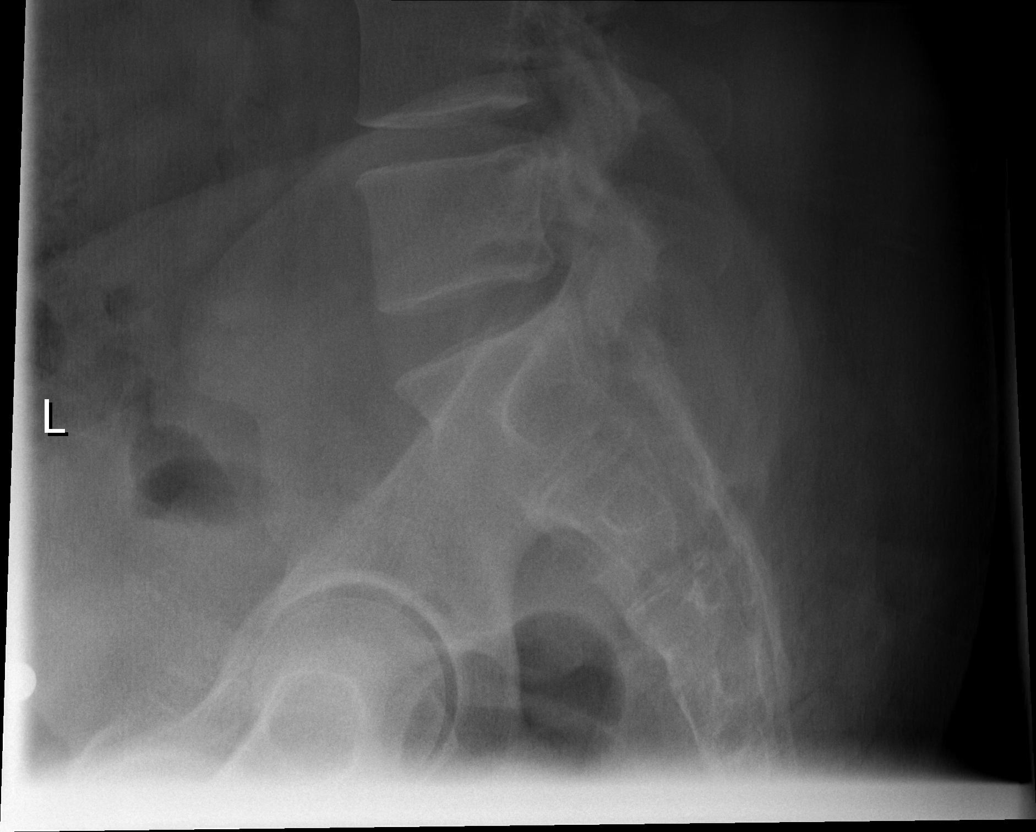

[6 of 6 positions shown; findings below may reference images not displayed]

FINDINGS: There is no evidence of lumbar spine fracture.  Alignment
is normal.  Intervertebral disc spaces are maintained.
IMPRESSION: Negative.
